# Patient Record
Sex: Female | Born: 1935 | Race: White | Hispanic: Yes | Smoking: Never smoker
Health system: Southern US, Community
[De-identification: ages and names within clinical notes are randomized; demographics above are authoritative.]

## PROBLEM LIST (undated history)

## (undated) DIAGNOSIS — I10 Essential (primary) hypertension: Secondary | ICD-10-CM

## (undated) DIAGNOSIS — I429 Cardiomyopathy, unspecified: Secondary | ICD-10-CM

## (undated) DIAGNOSIS — E7800 Pure hypercholesterolemia, unspecified: Secondary | ICD-10-CM

## (undated) DIAGNOSIS — H9190 Unspecified hearing loss, unspecified ear: Secondary | ICD-10-CM

## (undated) DIAGNOSIS — M1990 Unspecified osteoarthritis, unspecified site: Secondary | ICD-10-CM

## (undated) DIAGNOSIS — Z960 Presence of urogenital implants: Secondary | ICD-10-CM

## (undated) DIAGNOSIS — I2510 Atherosclerotic heart disease of native coronary artery without angina pectoris: Secondary | ICD-10-CM

## (undated) HISTORY — DX: Unspecified osteoarthritis, unspecified site: M19.90

## (undated) HISTORY — PX: CHOLECYSTECTOMY: SHX55

## (undated) HISTORY — DX: Essential (primary) hypertension: I10

## (undated) HISTORY — PX: CARDIAC CATHETERIZATION: SHX172

## (undated) HISTORY — PX: VAGINAL HYSTERECTOMY: SUR661

## (undated) HISTORY — DX: Atherosclerotic heart disease of native coronary artery without angina pectoris: I25.10

## (undated) HISTORY — DX: Cardiomyopathy, unspecified: I42.9

## (undated) HISTORY — DX: Pure hypercholesterolemia, unspecified: E78.00

## (undated) HISTORY — DX: Presence of urogenital implants: Z96.0

## (undated) HISTORY — DX: Unspecified hearing loss, unspecified ear: H91.90

---

## 1998-01-22 ENCOUNTER — Other Ambulatory Visit: Admission: RE | Admit: 1998-01-22 | Discharge: 1998-01-22 | Payer: Self-pay | Admitting: Gynecology

## 1998-01-30 ENCOUNTER — Ambulatory Visit (HOSPITAL_COMMUNITY): Admission: RE | Admit: 1998-01-30 | Discharge: 1998-01-30 | Payer: Self-pay | Admitting: Gastroenterology

## 2000-01-27 ENCOUNTER — Other Ambulatory Visit: Admission: RE | Admit: 2000-01-27 | Discharge: 2000-01-27 | Payer: Self-pay | Admitting: Gynecology

## 2001-01-27 ENCOUNTER — Other Ambulatory Visit: Admission: RE | Admit: 2001-01-27 | Discharge: 2001-01-27 | Payer: Self-pay | Admitting: Gynecology

## 2002-02-01 ENCOUNTER — Other Ambulatory Visit: Admission: RE | Admit: 2002-02-01 | Discharge: 2002-02-01 | Payer: Self-pay | Admitting: Gynecology

## 2004-02-06 ENCOUNTER — Other Ambulatory Visit: Admission: RE | Admit: 2004-02-06 | Discharge: 2004-02-06 | Payer: Self-pay | Admitting: Gynecology

## 2006-02-17 ENCOUNTER — Other Ambulatory Visit: Admission: RE | Admit: 2006-02-17 | Discharge: 2006-02-17 | Payer: Self-pay | Admitting: Gynecology

## 2012-04-04 ENCOUNTER — Encounter: Payer: Self-pay | Admitting: Gynecology

## 2012-06-21 ENCOUNTER — Ambulatory Visit: Payer: Self-pay | Admitting: Gynecology

## 2012-07-05 ENCOUNTER — Ambulatory Visit (INDEPENDENT_AMBULATORY_CARE_PROVIDER_SITE_OTHER): Payer: Medicare Other | Admitting: Gynecology

## 2012-07-05 ENCOUNTER — Encounter: Payer: Self-pay | Admitting: Gynecology

## 2012-07-05 VITALS — BP 130/70 | Ht 62.0 in | Wt 141.0 lb

## 2012-07-05 DIAGNOSIS — Z79890 Hormone replacement therapy (postmenopausal): Secondary | ICD-10-CM

## 2012-07-05 DIAGNOSIS — N993 Prolapse of vaginal vault after hysterectomy: Secondary | ICD-10-CM

## 2012-07-05 DIAGNOSIS — N952 Postmenopausal atrophic vaginitis: Secondary | ICD-10-CM

## 2012-07-05 MED ORDER — ESTRADIOL 0.1 MG/GM VA CREA: TOPICAL_CREAM | VAGINAL | Status: DC

## 2012-07-05 NOTE — Patient Instructions (Signed)
Followup in 4 months for pessary recheck. Stop using the estrogen patches. Continue using the estrogen vaginal cream twice weekly.

## 2012-07-05 NOTE — Progress Notes (Signed)
Kristen Lewis 1925-12-05 [id number]        79 y.o.  [id number] former patient of Dr. Nicholas Lose with pessary for vaginal prolapse. Several issues noted below.  Past medical history,surgical history, medications, allergies, family history and social history were all reviewed and documented in the EPIC chart. ROS:  Was performed and pertinent positives and negatives are included in the history.  Exam: Kim assistant Filed Vitals:   07/05/12 1029  BP: 130/70  Height: 5\' 2"  (1.575 m)  Weight: 141 lb (63.957 kg)   General appearance  Normal Skin grossly normal Head/Neck normal with no cervical or supraclavicular adenopathy thyroid normal Lungs  clear Cardiac RR, without RMG Abdominal  soft, nontender, without masses, organomegaly or hernia Breasts without masses, retractions, discharge or axillary adenopathy. Pelvic  Ext/BUS/vagina  with generalized atrophic changes  Gellhorn pessary was removed. Did have second-degree rectocele. Upper vagina appeared well supported but I did not provoke prolapse with straining. No significant cystocele Adnexa  Without masses or tenderness    Anus and perineum  normal   Rectovaginal  normal sphincter tone without palpated masses or tenderness.    Assessment/Plan:  79 y.o. [id number] female  1. History of vaginal prolapse with Gellhorn pessary doing well. Has been using this for years without difficulty. Has it changed every 4 months.  I removed her pessary cleaned and replaced it. She did not have overt vaginal apex prolapse although I did not have her strain significantly to provoke this. She did have a second degree to third degree rectocele which was well supported with the pessary. Patient will return in 4 months for pessary recheck. 2. Hormone replacement. Patient using Vivelle-Dot 0.25 mg patches and has been for a number of years. Started in her mid 13s early 76s. Also uses Estrace vaginal cream twice weekly quarter applicator. I reviewed the whole issue of  HRT to include the WHI study and increased risk of stroke heart attack DVT and breast cancer. The ACOG NAMS statements for lowest dose for shortest period of time also reviewed. My suggestion would be to stop the Vivelle patch and continue with the vaginal Estrace cream at present and she agrees with this. The issues of absorption also discussed. 3. Mammography 03/2012. Continue with annual mammography. SBE monthly reviewed. 4. Pap smear. No Pap smear done today. No history of abnormal Pap smears previously.  I reviewed current screening guidelines and as she is status post vaginal hysterectomy for benign indications and over the age of 16 I recommend stop screening and she agrees with this. 5. Colonoscopy 5 years ago. Repeat at their recommended interval. 6. Health maintenance. Actively is followed by her primary physician and the blood work done today. Followup 4 months for pessary recheck.  Dara Lords MD, 11:45 AM 07/05/2012

## 2012-10-26 ENCOUNTER — Ambulatory Visit (INDEPENDENT_AMBULATORY_CARE_PROVIDER_SITE_OTHER): Payer: Medicare Other | Admitting: Gynecology

## 2012-10-26 ENCOUNTER — Encounter: Payer: Self-pay | Admitting: Gynecology

## 2012-10-26 DIAGNOSIS — Z79890 Hormone replacement therapy (postmenopausal): Secondary | ICD-10-CM

## 2012-10-26 DIAGNOSIS — N816 Rectocele: Secondary | ICD-10-CM

## 2012-10-26 NOTE — Progress Notes (Signed)
Patient presents for followup recheck for pessary. Has stopped her Vivelle patches and done well with this. No significant hot flushes sweats or other symptoms. Continues to use estrogen vaginal cream intermittently.  Exam with Selena Batten assistant External BUS vagina with atrophic changes. Gelhorn pessary removed cleaned and replaced without difficulty. Second degree rectocele noted after removal. Cuff appears well supported. No overt cystocele at this time but did not provoke on exam. No evidence of ulceration or vaginal trauma. Bimanual without masses or tenderness  Assessment and plan: Vaginal prolapse with rectocele. Gellhorn pessary working well. Removed cleansed and replaced. Patient will followup in 4 months for recheck. Sooner if any issues. She appears to be doing well off of the Vivelle patches and will continue to stay off them. She'll continue to use the estrogen vaginal cream which helps with her vaginal atrophic changes.

## 2012-10-26 NOTE — Patient Instructions (Signed)
Follow up in 4 months for pessary recheck 

## 2013-02-21 ENCOUNTER — Ambulatory Visit (INDEPENDENT_AMBULATORY_CARE_PROVIDER_SITE_OTHER): Payer: Medicare Other | Admitting: Gynecology

## 2013-02-21 ENCOUNTER — Encounter: Payer: Self-pay | Admitting: Gynecology

## 2013-02-21 DIAGNOSIS — N993 Prolapse of vaginal vault after hysterectomy: Secondary | ICD-10-CM

## 2013-02-21 DIAGNOSIS — N816 Rectocele: Secondary | ICD-10-CM

## 2013-02-21 DIAGNOSIS — N952 Postmenopausal atrophic vaginitis: Secondary | ICD-10-CM

## 2013-02-21 NOTE — Patient Instructions (Signed)
Follow up in 6 months for annual exam

## 2013-02-21 NOTE — Progress Notes (Signed)
Patient presents for pessary recheck. Has vaginal prolapse with rectocele status post hysterectomy in the past. Doing well with her pessary. Uses Estrace vaginal cream twice weekly. Has done well since stopping her estrogen patches.  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 Gelhorn pessary removed. Vaginal exam without evidence of irritation or erosion. Vaginal prolapse/rectocele noted. Bimanual exam without masses or tenderness. Pessary was cleansed and replaced.  Assessment and plan: Vaginal prolapse with rectocele doing well with Gellhorn pessary. Followup in 6 months for pessary recheck and general exam. Sooner if any issues. Continue on the estrogen vaginal cream twice weekly

## 2013-08-24 ENCOUNTER — Encounter: Payer: Medicare Other | Admitting: Gynecology

## 2013-08-29 ENCOUNTER — Encounter: Payer: Medicare Other | Admitting: Gynecology

## 2013-09-05 ENCOUNTER — Other Ambulatory Visit: Payer: Self-pay

## 2013-09-13 ENCOUNTER — Encounter: Payer: Medicare Other | Admitting: Gynecology

## 2013-11-01 ENCOUNTER — Encounter: Payer: Self-pay | Admitting: Gynecology

## 2013-11-01 ENCOUNTER — Ambulatory Visit (INDEPENDENT_AMBULATORY_CARE_PROVIDER_SITE_OTHER): Payer: Medicare Other | Admitting: Gynecology

## 2013-11-01 VITALS — BP 134/70 | Ht 61.5 in | Wt 138.0 lb

## 2013-11-01 DIAGNOSIS — N952 Postmenopausal atrophic vaginitis: Secondary | ICD-10-CM

## 2013-11-01 DIAGNOSIS — N993 Prolapse of vaginal vault after hysterectomy: Secondary | ICD-10-CM

## 2013-11-01 DIAGNOSIS — N816 Rectocele: Secondary | ICD-10-CM

## 2013-11-01 MED ORDER — ESTRADIOL 0.1 MG/GM VA CREA: TOPICAL_CREAM | VAGINAL | Status: DC

## 2013-11-01 NOTE — Progress Notes (Signed)
DAJA SHUPING 10-30-1925 [id number]        80 y.o.  [id number] for follow up exam.  Past medical history,surgical history, problem list, medications, allergies, family history and social history were all reviewed and documented as reviewed in the EPIC chart.  ROS:  12 system ROS performed with pertinent positives and negatives included in the history, assessment and plan.   Additional significant findings :  none   Exam: [facility] person Vitals:   11/01/13 1415  BP: 134/70  Height: 5' 1.5" (1.562 m)  Weight: 138 lb (62.596 kg)   General appearance:  Normal affect, orientation and appearance. Skin: Grossly normal HEENT: Without gross lesions.  No cervical or supraclavicular adenopathy. Thyroid normal.  Lungs:  Clear without wheezing, rales or rhonchi Cardiac: RR, without RMG Abdominal:  Soft, nontender, without masses, guarding, rebound, organomegaly or hernia Breasts:  Examined lying and sitting without masses, retractions, discharge or axillary adenopathy. Pelvic:  Ext/BUS/vagina generalized atrophic changes.  Gelhorn pessary was removed.  Vaginal prolapse and rectocele noted. Vaginal mucosa without evidence of erosions or irritation.  Bimanual exam without masses or tenderness. Gelhorn pessary was cleaned and replaced.  Anus and perineum  Normal   Rectovaginal  Normal sphincter tone without palpated masses or tenderness.    Assessment/Plan:  80 y.o. [id number] female for follow up exam.   1. Vaginal prolapse with rectocele. Using Gellhorn pessary doing well. Also uses Estrace vaginal cream twice weekly for vaginal support. We again discussed the issues and risks to include absorption and systemic side effects such as stroke heart attack DVT of breast cancer. Patient comfortable continuing I refilled her x1 year. Otherwise doing well with her pessary which was cleaned and replaced today. 2. Mammography 03/2012. Recommended patient schedule mammogram now she agrees to do so. SBE  monthly reviewed. 3. DEXA 2013. Patients in the process of scheduling now and will follow up for this. Increased calcium vitamin D reviewed. 4. Colonoscopy reported 6 years ago. States that they told her she no longer needed to do these. 5. Pap smear a number of years ago. No Pap smear done today.  No history of significant abnormal Pap smears. Status post hysterectomy for benign indications. Over the age of 6. Reviewed current screening guidelines we both agree to stop screening. 6. Health maintenance. No routine blood work done as she reports this done at her primary physician's office. Follow up in 6 months for pessary recheck, sooner as needed.     Dara Lords MD, 2:38 PM 11/01/2013

## 2013-11-01 NOTE — Patient Instructions (Signed)
Follow-up in 6 months for pessary recheck.  Sooner if any issues. 

## 2013-11-02 LAB — URINALYSIS W MICROSCOPIC + REFLEX CULTURE
BILIRUBIN URINE: NEGATIVE
CASTS: NONE SEEN
Crystals: NONE SEEN
Glucose, UA: NEGATIVE mg/dL
Ketones, ur: NEGATIVE mg/dL
Nitrite: NEGATIVE
PH: 6 (ref 5.0–8.0)
PROTEIN: 30 mg/dL — AB
Specific Gravity, Urine: >1.03 — ABNORMAL HIGH (ref 1.005–1.030)
Urobilinogen, UA: 0.2 mg/dL (ref 0.0–1.0)

## 2013-11-03 LAB — URINE CULTURE

## 2013-12-11 ENCOUNTER — Encounter: Payer: Self-pay | Admitting: Gynecology

## 2014-05-02 ENCOUNTER — Ambulatory Visit: Payer: Medicare Other | Admitting: Gynecology

## 2014-05-03 ENCOUNTER — Ambulatory Visit (INDEPENDENT_AMBULATORY_CARE_PROVIDER_SITE_OTHER): Payer: Medicare Other | Admitting: Gynecology

## 2014-05-03 ENCOUNTER — Encounter: Payer: Self-pay | Admitting: Gynecology

## 2014-05-03 VITALS — BP 138/80 | Ht 61.0 in | Wt 138.0 lb

## 2014-05-03 DIAGNOSIS — N993 Prolapse of vaginal vault after hysterectomy: Secondary | ICD-10-CM | POA: Diagnosis not present

## 2014-05-03 NOTE — Progress Notes (Signed)
Kristen DoomCatherine A Lewis Aug 22, 1925 [id number]        81 y.o.  [id number] presents for pessary check at 6 months from her last exam. History of vaginal vault prolapse with rectocele status post hysterectomy. Doing well with her Gellhorn pessary.  Past medical history,surgical history, problem list, medications, allergies, family history and social history were all reviewed and documented in the EPIC chart.  Directed ROS with pertinent positives and negatives documented in the history of present illness/assessment and plan.  Exam: Bari MantisKim Alexis assistant Filed Vitals:   05/03/14 1024  BP: 138/80  Height: 5\' 1"  (1.549 m)  Weight: 138 lb (62.596 kg)   General appearance:  Normal Abdomen soft nontender without masses guarding rebound Pelvic external BUS vagina with atrophic changes. Generalized prolapse after removal of her pessary with rectocele. No evidence of irritation or erosion in the vaginal mucosa.  Bimanual without masses or tenderness.  Her Gellhorn pessary was removed cleansed and replaced without difficulty.  Assessment/Plan:  81 y.o. [id number] with vaginal vault prolapse/rectocele after hysterectomy. Doing well with Gellhorn pessary. Using Estrace cream twice weekly. Reviewed issues of estrogen cream and absorption and risks of stroke heart attack DVT and breast cancer. Patient's comfortable continuing. We'll follow up in 6 months for annual exam. Sooner if any issues.    Dara LordsFONTAINE,Teng Decou P MD, 10:52 AM 05/03/2014

## 2014-05-03 NOTE — Patient Instructions (Signed)
Follow-up in 6 months for pessary recheck. 

## 2014-09-28 ENCOUNTER — Emergency Department (HOSPITAL_COMMUNITY)
Admission: EM | Admit: 2014-09-28 | Discharge: 2014-09-28 | Disposition: A | Discharge status: Home / Self Care | Payer: No Typology Code available for payment source | Attending: Emergency Medicine | Admitting: Emergency Medicine

## 2014-09-28 ENCOUNTER — Encounter (HOSPITAL_COMMUNITY): Payer: Self-pay | Admitting: Emergency Medicine

## 2014-09-28 DIAGNOSIS — Y998 Other external cause status: Secondary | ICD-10-CM | POA: Insufficient documentation

## 2014-09-28 DIAGNOSIS — Y92410 Unspecified street and highway as the place of occurrence of the external cause: Secondary | ICD-10-CM | POA: Insufficient documentation

## 2014-09-28 DIAGNOSIS — H9190 Unspecified hearing loss, unspecified ear: Secondary | ICD-10-CM | POA: Diagnosis not present

## 2014-09-28 DIAGNOSIS — Z79899 Other long term (current) drug therapy: Secondary | ICD-10-CM | POA: Diagnosis not present

## 2014-09-28 DIAGNOSIS — E780 Pure hypercholesterolemia: Secondary | ICD-10-CM | POA: Insufficient documentation

## 2014-09-28 DIAGNOSIS — Y9389 Activity, other specified: Secondary | ICD-10-CM | POA: Insufficient documentation

## 2014-09-28 DIAGNOSIS — I10 Essential (primary) hypertension: Secondary | ICD-10-CM | POA: Insufficient documentation

## 2014-09-28 DIAGNOSIS — M1990 Unspecified osteoarthritis, unspecified site: Secondary | ICD-10-CM | POA: Diagnosis not present

## 2014-09-28 DIAGNOSIS — S1081XA Abrasion of other specified part of neck, initial encounter: Secondary | ICD-10-CM | POA: Diagnosis not present

## 2014-09-28 DIAGNOSIS — S199XXA Unspecified injury of neck, initial encounter: Secondary | ICD-10-CM | POA: Diagnosis present
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
## 2014-09-28 NOTE — Discharge Instructions (Signed)
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
## 2014-11-05 ENCOUNTER — Ambulatory Visit (INDEPENDENT_AMBULATORY_CARE_PROVIDER_SITE_OTHER): Payer: Medicare Other | Admitting: Gynecology

## 2014-11-05 ENCOUNTER — Encounter: Payer: Self-pay | Admitting: Gynecology

## 2014-11-05 VITALS — BP 120/78 | Ht 62.0 in | Wt 145.0 lb

## 2014-11-05 DIAGNOSIS — N819 Female genital prolapse, unspecified: Secondary | ICD-10-CM | POA: Diagnosis not present

## 2014-11-05 DIAGNOSIS — Z01419 Encounter for gynecological examination (general) (routine) without abnormal findings: Secondary | ICD-10-CM

## 2014-11-05 DIAGNOSIS — Z23 Encounter for immunization: Secondary | ICD-10-CM | POA: Diagnosis not present

## 2014-11-05 DIAGNOSIS — N952 Postmenopausal atrophic vaginitis: Secondary | ICD-10-CM | POA: Diagnosis not present

## 2014-11-05 DIAGNOSIS — N816 Rectocele: Secondary | ICD-10-CM | POA: Diagnosis not present

## 2014-11-05 MED ORDER — ESTRADIOL 0.1 MG/GM VA CREA: TOPICAL_CREAM | VAGINAL | Status: DC

## 2014-11-05 NOTE — Patient Instructions (Signed)

## 2014-11-05 NOTE — Progress Notes (Signed)
OCTA UPLINGER 1925-09-13 [id number]        81 y.o.  [id number] for breast and pelvic exam.  Past medical history,surgical history, problem list, medications, allergies, family history and social history were all reviewed and documented as reviewed in the EPIC chart.  ROS:  Performed with pertinent positives and negatives included in the history, assessment and plan.   Additional significant findings :  none   Exam: [facility] person Vitals:   11/05/14 1401  BP: 120/78  Height:  (1.575 m)  Weight: 145 lb (65.772 kg)   General appearance:  Normal affect, orientation and appearance. Skin: Grossly normal HEENT: Without gross lesions.  No cervical or supraclavicular adenopathy. Thyroid normal.  Lungs:  Clear without wheezing, rales or rhonchi Cardiac: RR, without RMG Abdominal:  Soft, nontender, without masses, guarding, rebound, organomegaly or hernia Breasts:  Examined lying and sitting without masses, retractions, discharge or axillary adenopathy. Pelvic:  Ext/BUS/vagina with atrophic changes.  Her Gellhorn pessary was removed and vaginal exam shows no evidence of irritation or erosion. Pessary was cleansed and replaced without difficulty.  Bimanual exam without masses or tenderness  Anus and perineum  Normal   Rectovaginal  Normal sphincter tone without palpated masses or tenderness.    Assessment/Plan:  81 y.o. [id number] female for breast and pelvic exam.   1. Vaginal vault prolapse with rectocele. Status post hysterectomy in the past. Using Gellhorn pessary with good results. Using Estrace vaginal cream twice weekly for vaginal support doing well with this. We discussed the issues of absorption risks in the past on multiple occasions. She is comfortable using this and I refilled her 1 year. 2. Pap smear a number of years ago. No Pap smear done today.  No history of abnormal Pap smears previously.  We both decided to stop screening per current screening  guidelines. 3. Mammography 03/2014. Continue with annual mammography. SBE month are reviewed. 4. Bone density 2016.  I do not have copies of these results and she will follow up with her primary physician in reference to this he was following her bone health. 5. Colonoscopy 7 years ago. Repeat at their recommended interval. 6. Health maintenance. No routine blood work done as this is done through her primary physician's office. Follow up 1 year, sooner as needed.   Dara Lords MD, 2:30 PM 11/05/2014

## 2014-11-05 NOTE — Addendum Note (Signed)
Addended by: Dayna Barker on: 11/05/2014 03:37 PM   Modules accepted: Orders

## 2015-04-25 ENCOUNTER — Encounter: Payer: Self-pay | Admitting: Gynecology

## 2015-04-29 ENCOUNTER — Ambulatory Visit (INDEPENDENT_AMBULATORY_CARE_PROVIDER_SITE_OTHER): Payer: Medicare Other | Admitting: Gynecology

## 2015-04-29 ENCOUNTER — Encounter: Payer: Self-pay | Admitting: Gynecology

## 2015-04-29 VITALS — BP 130/60

## 2015-04-29 DIAGNOSIS — N993 Prolapse of vaginal vault after hysterectomy: Secondary | ICD-10-CM

## 2015-04-29 NOTE — Patient Instructions (Signed)
Follow up in 4-6 months for pessary recheck 

## 2015-04-29 NOTE — Progress Notes (Signed)
    Kristen DoomCatherine A Lewis 07/24/1925 [id number]        82 y.o.  [id number] presents for pessary check with history of vaginal vault prolapse after hysterectomy with good results using a Gellhorn pessary.  Also using vaginal estrogen cream once weekly. No bleeding or discomfort  Past medical history,surgical history, problem list, medications, allergies, family history and social history were all reviewed and documented in the EPIC chart.  Directed ROS with pertinent positives and negatives documented in the history of present illness/assessment and plan.  Exam: Kennon PortelaKim Gardner assistant Filed Vitals:   04/29/15 1422  BP: 130/60   General appearance:  Normal Abdomen soft nontender without masses guarding rebound Pelvic external BUS vagina with atrophic changes. Gelhorn pessary removed. Vaginal mucosa without evidence of irritation or erosion. Prolapse/rectocele noted. Bimanual exam without masses or tenderness  Gelhorn pessary was removed, cleansed and replaced without difficulty.  Assessment/Plan:  82 y.o. [id number] with vaginal vault prolapse after hysterectomy with good response to Gellhorn pessary. Follow up in 4-6 months for pessary recheck.    Dara LordsFONTAINE,Shoaib Siefker P MD, 2:46 PM 04/29/2015

## 2015-09-30 ENCOUNTER — Ambulatory Visit: Payer: Medicare Other | Admitting: Gynecology

## 2015-10-07 ENCOUNTER — Encounter: Payer: Self-pay | Admitting: Gynecology

## 2015-10-07 ENCOUNTER — Ambulatory Visit (INDEPENDENT_AMBULATORY_CARE_PROVIDER_SITE_OTHER): Payer: Medicare Other | Admitting: Gynecology

## 2015-10-07 VITALS — BP 130/70

## 2015-10-07 DIAGNOSIS — R922 Inconclusive mammogram: Secondary | ICD-10-CM

## 2015-10-07 DIAGNOSIS — N993 Prolapse of vaginal vault after hysterectomy: Secondary | ICD-10-CM | POA: Diagnosis not present

## 2015-10-07 NOTE — Patient Instructions (Signed)
Follow up in 6 months for pessary reexamination. 

## 2015-10-07 NOTE — Progress Notes (Signed)
    Kristen DoomCatherine A Lewis [birth year] [id number]        82 y.o.  [id number] dents for pessary check with history of vaginal vault prolapse after hysterectomy. Using Gellhorn pessary with good results. Also using vaginal estrogen cream once weekly for vaginal support. Patient does have a history of dense breast and asked by would do a breast exam. She has no specific palpable abnormalities but would feel more reassured with a provider exam.  Past medical history,surgical history, problem list, medications, allergies, family history and social history were all reviewed and documented in the EPIC chart.  Directed ROS with pertinent positives and negatives documented in the history of present illness/assessment and plan.  Exam: Kennon PortelaKim Gardner assistant Vitals:   10/07/15 1403  BP: 130/70   General appearance:  Normal Both breast examined lying and sitting without masses, retractions, discharge, adenopathy Pelvic external BUS vagina with atrophic changes. Gellhorn pessary was removed. Vaginal mucosa without evidence of significant irritation or erosion. Prolapse/rectocele noted. Bimanual exam without masses or tenderness.  Procedure: Patients Gellhorn pessary was removed, cleansed and replaced without difficulty.  Assessment/Plan:  82 y.o. [id number] with vaginal vault prolapse after hysterectomy. Has good response to Gellhorn pessary. Has normal breast exam. Will continue with annual mammography. Will follow up in 6 months for pessary recheck. Will continue to use estrogen vaginal cream once weekly.    Dara LordsFONTAINE,Natina Wiginton P MD, 2:24 PM 10/07/2015

## 2016-02-17 ENCOUNTER — Inpatient Hospital Stay (HOSPITAL_COMMUNITY): Payer: Medicare Other

## 2016-02-17 ENCOUNTER — Inpatient Hospital Stay (HOSPITAL_COMMUNITY)
Admission: AD | Admit: 2016-02-17 | Discharge: 2016-02-20 | DRG: 175 | Disposition: A | Discharge status: Home Health | Payer: Medicare Other | Source: Ambulatory Visit | Attending: Family Medicine | Admitting: Family Medicine

## 2016-02-17 ENCOUNTER — Encounter (HOSPITAL_COMMUNITY): Payer: Self-pay

## 2016-02-17 DIAGNOSIS — R0600 Dyspnea, unspecified: Secondary | ICD-10-CM | POA: Diagnosis not present

## 2016-02-17 DIAGNOSIS — E876 Hypokalemia: Secondary | ICD-10-CM | POA: Diagnosis present

## 2016-02-17 DIAGNOSIS — Z79899 Other long term (current) drug therapy: Secondary | ICD-10-CM | POA: Diagnosis not present

## 2016-02-17 DIAGNOSIS — I252 Old myocardial infarction: Secondary | ICD-10-CM

## 2016-02-17 DIAGNOSIS — R05 Cough: Secondary | ICD-10-CM | POA: Diagnosis present

## 2016-02-17 DIAGNOSIS — J9811 Atelectasis: Secondary | ICD-10-CM | POA: Diagnosis present

## 2016-02-17 DIAGNOSIS — I2699 Other pulmonary embolism without acute cor pulmonale: Secondary | ICD-10-CM | POA: Diagnosis present

## 2016-02-17 DIAGNOSIS — Z7982 Long term (current) use of aspirin: Secondary | ICD-10-CM

## 2016-02-17 DIAGNOSIS — I5042 Chronic combined systolic (congestive) and diastolic (congestive) heart failure: Secondary | ICD-10-CM | POA: Diagnosis present

## 2016-02-17 DIAGNOSIS — I110 Hypertensive heart disease with heart failure: Secondary | ICD-10-CM | POA: Diagnosis present

## 2016-02-17 DIAGNOSIS — J9601 Acute respiratory failure with hypoxia: Secondary | ICD-10-CM | POA: Diagnosis present

## 2016-02-17 DIAGNOSIS — E785 Hyperlipidemia, unspecified: Secondary | ICD-10-CM

## 2016-02-17 DIAGNOSIS — I2510 Atherosclerotic heart disease of native coronary artery without angina pectoris: Secondary | ICD-10-CM | POA: Diagnosis present

## 2016-02-17 DIAGNOSIS — D649 Anemia, unspecified: Secondary | ICD-10-CM | POA: Diagnosis present

## 2016-02-17 DIAGNOSIS — E7800 Pure hypercholesterolemia, unspecified: Secondary | ICD-10-CM | POA: Diagnosis present

## 2016-02-17 DIAGNOSIS — Z9861 Coronary angioplasty status: Secondary | ICD-10-CM

## 2016-02-17 DIAGNOSIS — I509 Heart failure, unspecified: Secondary | ICD-10-CM | POA: Diagnosis not present

## 2016-02-17 DIAGNOSIS — Z8249 Family history of ischemic heart disease and other diseases of the circulatory system: Secondary | ICD-10-CM

## 2016-02-17 DIAGNOSIS — I10 Essential (primary) hypertension: Secondary | ICD-10-CM | POA: Diagnosis not present

## 2016-02-17 DIAGNOSIS — M1990 Unspecified osteoarthritis, unspecified site: Secondary | ICD-10-CM | POA: Diagnosis present

## 2016-02-17 DIAGNOSIS — E871 Hypo-osmolality and hyponatremia: Secondary | ICD-10-CM

## 2016-02-17 DIAGNOSIS — Z800 Family history of malignant neoplasm of digestive organs: Secondary | ICD-10-CM

## 2016-02-17 DIAGNOSIS — J189 Pneumonia, unspecified organism: Secondary | ICD-10-CM | POA: Diagnosis present

## 2016-02-17 DIAGNOSIS — Z9889 Other specified postprocedural states: Secondary | ICD-10-CM

## 2016-02-17 DIAGNOSIS — Y95 Nosocomial condition: Secondary | ICD-10-CM | POA: Diagnosis present

## 2016-02-17 DIAGNOSIS — R079 Chest pain, unspecified: Secondary | ICD-10-CM

## 2016-02-17 DIAGNOSIS — R0602 Shortness of breath: Secondary | ICD-10-CM

## 2016-02-17 DIAGNOSIS — J90 Pleural effusion, not elsewhere classified: Secondary | ICD-10-CM

## 2016-02-17 LAB — CBC WITH DIFFERENTIAL/PLATELET
BASOS ABS: 0 10*3/uL (ref 0.0–0.1)
Basophils Relative: 0 %
EOS PCT: 1 %
Eosinophils Absolute: 0.1 10*3/uL (ref 0.0–0.7)
HCT: 30.6 % — ABNORMAL LOW (ref 36.0–46.0)
Hemoglobin: 10 g/dL — ABNORMAL LOW (ref 12.0–15.0)
LYMPHS PCT: 10 %
Lymphs Abs: 1.4 10*3/uL (ref 0.7–4.0)
MCH: 29.2 pg (ref 26.0–34.0)
MCHC: 32.7 g/dL (ref 30.0–36.0)
MCV: 89.5 fL (ref 78.0–100.0)
MONO ABS: 1.3 10*3/uL — AB (ref 0.1–1.0)
Monocytes Relative: 9 %
NEUTROS ABS: 11.4 10*3/uL — AB (ref 1.7–7.7)
Neutrophils Relative %: 80 %
PLATELETS: 294 10*3/uL (ref 150–400)
RBC: 3.42 MIL/uL — AB (ref 3.87–5.11)
RDW: 14.9 % (ref 11.5–15.5)
WBC: 14.1 10*3/uL — AB (ref 4.0–10.5)

## 2016-02-17 LAB — COMPREHENSIVE METABOLIC PANEL
ALT: 16 U/L (ref 14–54)
AST: 16 U/L (ref 15–41)
Albumin: 3.3 g/dL — ABNORMAL LOW (ref 3.5–5.0)
Alkaline Phosphatase: 74 U/L (ref 38–126)
Anion gap: 11 (ref 5–15)
BUN: 14 mg/dL (ref 6–20)
CHLORIDE: 99 mmol/L — AB (ref 101–111)
CO2: 24 mmol/L (ref 22–32)
CREATININE: 0.77 mg/dL (ref 0.44–1.00)
Calcium: 8.8 mg/dL — ABNORMAL LOW (ref 8.9–10.3)
GFR calc Af Amer: >60 mL/min (ref >60)
GLUCOSE: 170 mg/dL — AB (ref 65–99)
POTASSIUM: 3.7 mmol/L (ref 3.5–5.1)
SODIUM: 134 mmol/L — AB (ref 135–145)
Total Bilirubin: 0.8 mg/dL (ref 0.3–1.2)
Total Protein: 7.5 g/dL (ref 6.5–8.1)

## 2016-02-17 LAB — D-DIMER, QUANTITATIVE: D-Dimer, Quant: 1.83 ug/mL-FEU — ABNORMAL HIGH (ref 0.00–0.50)

## 2016-02-17 LAB — TROPONIN I: Troponin I: 0.03 ng/mL (ref <0.03)

## 2016-02-17 LAB — BRAIN NATRIURETIC PEPTIDE: B Natriuretic Peptide: 949 pg/mL — ABNORMAL HIGH (ref 0.0–100.0)

## 2016-02-17 MED ORDER — FUROSEMIDE 40 MG PO TABS
40.0000 mg | ORAL_TABLET | Freq: Every day | ORAL | Status: DC
  Administered 2016-02-18: 40 mg via ORAL
  Filled 2016-02-17: qty 1

## 2016-02-17 MED ORDER — NITROGLYCERIN 0.4 MG/HR TD PT24
0.4000 mg | MEDICATED_PATCH | Freq: Every day | TRANSDERMAL | Status: DC
  Administered 2016-02-17 – 2016-02-20 (×4): 0.4 mg via TRANSDERMAL
  Filled 2016-02-17 (×4): qty 1

## 2016-02-17 MED ORDER — ONDANSETRON HCL 4 MG/2ML IJ SOLN: 4.0000 mg | Freq: Four times a day (QID) | INTRAMUSCULAR | Status: DC | PRN

## 2016-02-17 MED ORDER — OSELTAMIVIR PHOSPHATE 75 MG PO CAPS
75.0000 mg | ORAL_CAPSULE | Freq: Two times a day (BID) | ORAL | Status: DC
  Administered 2016-02-17 – 2016-02-18 (×2): 75 mg via ORAL
  Filled 2016-02-17 (×2): qty 1

## 2016-02-17 MED ORDER — DEXTROSE 5 % IV SOLN
1.0000 g | Freq: Three times a day (TID) | INTRAVENOUS | Status: DC
  Administered 2016-02-17 – 2016-02-18 (×2): 1 g via INTRAVENOUS
  Filled 2016-02-17 (×3): qty 1

## 2016-02-17 MED ORDER — PAROXETINE HCL 20 MG PO TABS: 20.0000 mg | ORAL_TABLET | Freq: Every day | ORAL | Status: DC

## 2016-02-17 MED ORDER — ACETAMINOPHEN 650 MG RE SUPP: 650.0000 mg | Freq: Four times a day (QID) | RECTAL | Status: DC | PRN

## 2016-02-17 MED ORDER — NITROGLYCERIN 0.4 MG SL SUBL: 0.4000 mg | SUBLINGUAL_TABLET | SUBLINGUAL | Status: DC | PRN

## 2016-02-17 MED ORDER — CARVEDILOL 6.25 MG PO TABS
6.2500 mg | ORAL_TABLET | Freq: Two times a day (BID) | ORAL | Status: DC
  Administered 2016-02-18 – 2016-02-20 (×5): 6.25 mg via ORAL
  Filled 2016-02-17 (×5): qty 1

## 2016-02-17 MED ORDER — ENOXAPARIN SODIUM 40 MG/0.4ML ~~LOC~~ SOLN
40.0000 mg | SUBCUTANEOUS | Status: DC
  Administered 2016-02-17: 40 mg via SUBCUTANEOUS
  Filled 2016-02-17: qty 0.4

## 2016-02-17 MED ORDER — ATORVASTATIN CALCIUM 10 MG PO TABS
10.0000 mg | ORAL_TABLET | Freq: Every day | ORAL | Status: DC
  Administered 2016-02-18 – 2016-02-19 (×2): 10 mg via ORAL
  Filled 2016-02-17 (×2): qty 1

## 2016-02-17 MED ORDER — VANCOMYCIN HCL IN DEXTROSE 1-5 GM/200ML-% IV SOLN
1000.0000 mg | INTRAVENOUS | Status: AC
  Administered 2016-02-18: 1000 mg via INTRAVENOUS
  Filled 2016-02-17: qty 200

## 2016-02-17 MED ORDER — ACETAMINOPHEN 325 MG PO TABS: 650.0000 mg | ORAL_TABLET | Freq: Four times a day (QID) | ORAL | Status: DC | PRN

## 2016-02-17 MED ORDER — ONDANSETRON HCL 4 MG PO TABS: 4.0000 mg | ORAL_TABLET | Freq: Four times a day (QID) | ORAL | Status: DC | PRN

## 2016-02-17 MED ORDER — PAROXETINE HCL 20 MG PO TABS
20.0000 mg | ORAL_TABLET | Freq: Every day | ORAL | Status: DC
  Administered 2016-02-18 – 2016-02-20 (×3): 20 mg via ORAL
  Filled 2016-02-17 (×3): qty 1

## 2016-02-17 NOTE — H&P (Signed)
History and Physical    Kristen Lewis JYN:[id number] DOB: January 05, 1926 DOA: 02/17/2016  PCP: Gaspar Garbe, MD  Patient coming from: Home. Patient was a direct admit. Patient was sent by Dr. Celene Skeen.  Chief Complaint: Chest pain. Cough.  HPI: Kristen Lewis is a 83 y.o. female with history of hypertension CHF has been experiencing chest pain on deep inspiration last 2 days. Patient also has been having some non-productive cough with subjective feeling of fever and chills. Patient went to the urgent care center. At the urgent care center patient was recorded to have a temperature of 100.20F. Labs done showed WBC of 15,000 with chest x-ray showing left lower lobe infiltrates and effusion. Since patient has persistent pleuritic chest pain patient was admitted directly for further management of pneumonia.  Patient states in November 2017 2 months ago patient had been admitted to the hospital in [place] for CHF and at that time patient also had undergone cardiac cath but did not have any stents placed. Since then patient has been on Lasix Coreg aspirin and statins.  Patient does get short of breath on exertion sometimes which improves with Lasix. Patient is presently not short of breath.   ED Course: Patient was a direct admit.  Review of Systems: As per HPI, rest all negative.   Past Medical History:  Diagnosis Date  . Arthritis   . Elevated cholesterol   . Hearing deficit   . Hypertension   . Vaginal pessary present     Past Surgical History:  Procedure Laterality Date  . CARDIAC CATHETERIZATION    . CHOLECYSTECTOMY    . VAGINAL HYSTERECTOMY     Leiomyomata/bleeding     reports that she has never smoked. She has never used smokeless tobacco. She reports that she drinks alcohol. She reports that she does not use drugs.  No Known Allergies  Family History  Problem Relation Age of Onset  . Cancer Mother     Intestinal/Liver  . Hypertension Mother   . Hypertension  Father   . Heart attack Father   . Hypertension Brother   . Heart disease Brother     Prior to Admission medications   Medication Sig Start Date End Date Taking? Authorizing Provider  carvedilol (COREG) 3.125 MG tablet Take 3.125 mg by mouth 2 (two) times daily. 01/31/16  Yes Historical Provider, MD  cholecalciferol (VITAMIN D) 1000 UNITS tablet Take 1,000 Units by mouth every morning.    Yes Historical Provider, MD  Potassium Chloride ER 20 MEQ TBCR Take 20 mEq by mouth daily. 02/06/16  Yes Historical Provider, MD  aspirin 81 MG tablet Take 81 mg by mouth daily.    Historical Provider, MD  atorvastatin (LIPITOR) 20 MG tablet Take 20 mg by mouth daily. 02/01/16   Historical Provider, MD  Calcium-Magnesium-Vitamin D (CALCIUM 500 PO) Take 1 tablet by mouth daily.    Historical Provider, MD  estradiol (ESTRACE) 0.1 MG/GM vaginal cream Use 1/4 applicator twice weekly 11/05/14   Dara Lords, MD  furosemide (LASIX) 40 MG tablet Take 40 mg by mouth daily. 02/06/16   Historical Provider, MD  ibuprofen (ADVIL,MOTRIN) 600 MG tablet Take 600 mg by mouth daily with breakfast.    Historical Provider, MD  lisinopril (PRINIVIL,ZESTRIL) 40 MG tablet Take 40 mg by mouth daily.    Historical Provider, MD  Multiple Vitamins-Minerals ([employer] SPECTRAVITE ADULT 50+) TABS Take 1 tablet by mouth daily.    Historical Provider, MD  nitroGLYCERIN (NITRODUR - DOSED  IN MG/24 HR) 0.2 mg/hr patch Place 1 patch onto the skin daily. 02/01/16   Historical Provider, MD  PARoxetine (PAXIL) 20 MG tablet Take 20 mg by mouth daily.    Historical Provider, MD    Physical Exam: Vitals:   02/17/16 2055 02/18/16 0453  BP: (!) 147/80 123/76  Pulse: 85 (!) 102  Resp: 18 16  Temp: 98.3 F (36.8 C) 99.3 F (37.4 C)  TempSrc: Oral Oral  SpO2: 100% 98%  Weight: 61.9 kg (136 lb 7.4 oz)   Height: 5\' 2"  (1.575 m)       Constitutional: Moderately built and nourished. Vitals:   02/17/16 2055 02/18/16 0453  BP: (!) 147/80  123/76  Pulse: 85 (!) 102  Resp: 18 16  Temp: 98.3 F (36.8 C) 99.3 F (37.4 C)  TempSrc: Oral Oral  SpO2: 100% 98%  Weight: 61.9 kg (136 lb 7.4 oz)   Height: 5\' 2"  (1.575 m)    Eyes: Anicteric no pallor. ENMT: No discharge from the ears eyes nose and mouth. Neck: No mass felt. No neck rigidity. No JVD appreciated. Respiratory: No rhonchi or crepitations. Cardiovascular: S1 and S2 heard. No murmurs appreciated. Abdomen: Soft nontender bowel sounds present. No guarding or rigidity. Musculoskeletal: No edema. No joint effusion. Skin: No rash. Skin appears warm. Neurologic: Alert awake oriented to time place and person. Moves all extremities. Psychiatric: Appears normal. Normal affect.   Labs on Admission: I have personally reviewed following labs and imaging studies  CBC:  Recent Labs Lab 02/17/16 2256 02/18/16 0425  WBC 14.1* 12.1*  NEUTROABS 11.4*  --   HGB 10.0* 9.1*  HCT 30.6* 27.6*  MCV 89.5 88.2  PLT 294 283   Basic Metabolic Panel:  Recent Labs Lab 02/17/16 2256 02/18/16 0425  NA 134* 132*  K 3.7 3.4*  CL 99* 101  CO2 24 23  GLUCOSE 170* 148*  BUN 14 11  CREATININE 0.77 0.55  CALCIUM 8.8* 8.2*   GFR: Estimated Creatinine Clearance: 42.1 mL/min (by C-G formula based on SCr of 0.55 mg/dL). Liver Function Tests:  Recent Labs Lab 02/17/16 2256  AST 16  ALT 16  ALKPHOS 74  BILITOT 0.8  PROT 7.5  ALBUMIN 3.3*   No results for input(s): LIPASE, AMYLASE in the last 168 hours. No results for input(s): AMMONIA in the last 168 hours. Coagulation Profile: No results for input(s): INR, PROTIME in the last 168 hours. Cardiac Enzymes:  Recent Labs Lab 02/17/16 2256 02/18/16 0133  TROPONINI 0.03* <0.03   BNP (last 3 results) No results for input(s): PROBNP in the last 8760 hours. HbA1C: No results for input(s): HGBA1C in the last 72 hours. CBG: No results for input(s): GLUCAP in the last 168 hours. Lipid Profile: No results for input(s):  CHOL, HDL, LDLCALC, TRIG, CHOLHDL, LDLDIRECT in the last 72 hours. Thyroid Function Tests: No results for input(s): TSH, T4TOTAL, FREET4, T3FREE, THYROIDAB in the last 72 hours. Anemia Panel: No results for input(s): VITAMINB12, FOLATE, FERRITIN, TIBC, IRON, RETICCTPCT in the last 72 hours. Urine analysis:    Component Value Date/Time   COLORURINE YELLOW 11/01/2013 1633   APPEARANCEUR CLOUDY (A) 11/01/2013 1633   LABSPEC >1.030 (H) 11/01/2013 1633   PHURINE 6.0 11/01/2013 1633   GLUCOSEU NEG 11/01/2013 1633   HGBUR MOD (A) 11/01/2013 1633   BILIRUBINUR NEG 11/01/2013 1633   KETONESUR NEG 11/01/2013 1633   PROTEINUR 30 (A) 11/01/2013 1633   UROBILINOGEN 0.2 11/01/2013 1633   NITRITE NEG 11/01/2013 1633  LEUKOCYTESUR LARGE (A) 11/01/2013 1633   Sepsis Labs: @LABRCNTIP (procalcitonin:4,lacticidven:4) )No results found for this or any previous visit (from the past 240 hour(s)).   Radiological Exams on Admission: Dg Chest 2 View  Result Date: 02/17/2016 CLINICAL DATA:  Chest pain when breathing. Shortness of breath. Pneumonia for 2 days. History of myocardial infarct, bronchitis, former smoker, hypertension. EXAM: CHEST  2 VIEW COMPARISON:  None. FINDINGS: Moderate size left pleural effusion with atelectasis or consolidation in the left lung base. This may indicate pneumonia. Slight fibrosis or linear atelectasis in the right lung base. Central interstitial pattern with peribronchial thickening suggesting chronic bronchitis. No pneumothorax. Normal heart size and pulmonary vascularity. Calcified and tortuous aorta. Degenerative changes in the spine. Surgical clips in the upper abdomen. IMPRESSION: Consolidation or atelectasis in the left lung base with moderate left pleural effusion. Changes may indicate pneumonia. Followup PA and lateral chest X-ray is recommended in 3-4 weeks following appropriate clinical therapy to ensure resolution and exclude underlying malignancy. Electronically Signed    By: Burman NievesWilliam  Stevens M.D.   On: 02/17/2016 22:27    EKG: Independently reviewed. Sinus tachycardia.  Assessment/Plan Principal Problem:   HCAP (healthcare-associated pneumonia) Active Problems:   CAD (coronary artery disease)    CHF (congestive heart failure) (HCC)   Essential hypertension   Hyperlipidemia   Pneumonia    1. Healthcare associated pneumonia - since patient was recently admitted to the hospital in November 2017 we will keep patient on vancomycin and cefepime. Follow blood cultures sputum cultures and influenza PCR urine for Legionella and strep antigen. I will also place patient on Tamiflu until Flu results are available. Check d-dimer and cardiac markers. We'll get CT chest to make sure there is no loculation of the pleural effusion. 2. History of CHF - EF unknown. As per the patient patient was diagnosed with CHF last November 2017 and admitted to the hospital in [place]. Patient is on Lasix and Coreg which will be continued. During that admission patient also had a cardiac cath and patient states she did not have any stents placed. 3. Hyperlipidemia on statins. 4. Normocytic and normochromic anemia - follow CBC.   DVT prophylaxis: Lovenox. Code Status: Full code.  Family Communication: Discussed with patient.  Disposition Plan: Home.  Consults called: None.  Admission status: Inpatient.    Eduard ClosKAKRAKANDY,Kaci Dillie N. MD [facility] Pager [phone].  If 7PM-7AM, please contact night-coverage [email or web address] Password [email or web address]  02/18/2016, 5:41 AM

## 2016-02-18 ENCOUNTER — Inpatient Hospital Stay (HOSPITAL_COMMUNITY): Payer: Medicare Other

## 2016-02-18 ENCOUNTER — Encounter (HOSPITAL_COMMUNITY): Payer: Self-pay

## 2016-02-18 DIAGNOSIS — J90 Pleural effusion, not elsewhere classified: Secondary | ICD-10-CM

## 2016-02-18 DIAGNOSIS — E876 Hypokalemia: Secondary | ICD-10-CM

## 2016-02-18 DIAGNOSIS — I2699 Other pulmonary embolism without acute cor pulmonale: Secondary | ICD-10-CM

## 2016-02-18 DIAGNOSIS — E871 Hypo-osmolality and hyponatremia: Secondary | ICD-10-CM

## 2016-02-18 LAB — CBC
HEMATOCRIT: 27.6 % — AB (ref 36.0–46.0)
HEMOGLOBIN: 9.1 g/dL — AB (ref 12.0–15.0)
MCH: 29.1 pg (ref 26.0–34.0)
MCHC: 33 g/dL (ref 30.0–36.0)
MCV: 88.2 fL (ref 78.0–100.0)
Platelets: 283 10*3/uL (ref 150–400)
RBC: 3.13 MIL/uL — ABNORMAL LOW (ref 3.87–5.11)
RDW: 14.9 % (ref 11.5–15.5)
WBC: 12.1 10*3/uL — ABNORMAL HIGH (ref 4.0–10.5)

## 2016-02-18 LAB — BASIC METABOLIC PANEL
ANION GAP: 8 (ref 5–15)
BUN: 11 mg/dL (ref 6–20)
CHLORIDE: 101 mmol/L (ref 101–111)
CO2: 23 mmol/L (ref 22–32)
Calcium: 8.2 mg/dL — ABNORMAL LOW (ref 8.9–10.3)
Creatinine, Ser: 0.55 mg/dL (ref 0.44–1.00)
GFR calc Af Amer: >60 mL/min (ref >60)
GLUCOSE: 148 mg/dL — AB (ref 65–99)
POTASSIUM: 3.4 mmol/L — AB (ref 3.5–5.1)
Sodium: 132 mmol/L — ABNORMAL LOW (ref 135–145)

## 2016-02-18 LAB — HEPARIN LEVEL (UNFRACTIONATED): HEPARIN UNFRACTIONATED: 0.14 [IU]/mL — AB (ref 0.30–0.70)

## 2016-02-18 LAB — PROTIME-INR
INR: 1.22
PROTHROMBIN TIME: 15.5 s — AB (ref 11.4–15.2)

## 2016-02-18 LAB — APTT: APTT: 35 s (ref 24–36)

## 2016-02-18 LAB — TROPONIN I
Troponin I: <0.03 ng/mL (ref <0.03)
Troponin I: <0.03 ng/mL (ref <0.03)

## 2016-02-18 LAB — HIV ANTIBODY (ROUTINE TESTING W REFLEX): HIV Screen 4th Generation wRfx: NONREACTIVE

## 2016-02-18 LAB — INFLUENZA PANEL BY PCR (TYPE A & B)
INFLBPCR: NEGATIVE
Influenza A By PCR: NEGATIVE

## 2016-02-18 LAB — STREP PNEUMONIAE URINARY ANTIGEN: Strep Pneumo Urinary Antigen: NEGATIVE

## 2016-02-18 MED ORDER — IOPAMIDOL (ISOVUE-370) INJECTION 76%
INTRAVENOUS | Status: AC
  Filled 2016-02-18: qty 100

## 2016-02-18 MED ORDER — IOPAMIDOL (ISOVUE-370) INJECTION 76%
100.0000 mL | Freq: Once | INTRAVENOUS | Status: AC | PRN
  Administered 2016-02-18: 100 mL via INTRAVENOUS

## 2016-02-18 MED ORDER — POTASSIUM CHLORIDE CRYS ER 20 MEQ PO TBCR
40.0000 meq | EXTENDED_RELEASE_TABLET | Freq: Once | ORAL | Status: AC
  Administered 2016-02-18: 40 meq via ORAL
  Filled 2016-02-18: qty 2

## 2016-02-18 MED ORDER — SODIUM CHLORIDE 0.9 % IV SOLN
500.0000 mg | Freq: Two times a day (BID) | INTRAVENOUS | Status: DC
  Administered 2016-02-18 – 2016-02-20 (×4): 500 mg via INTRAVENOUS
  Filled 2016-02-18 (×5): qty 500

## 2016-02-18 MED ORDER — HEPARIN (PORCINE) IN NACL 100-0.45 UNIT/ML-% IJ SOLN
850.0000 [IU]/h | INTRAMUSCULAR | Status: DC
  Administered 2016-02-18: 850 [IU]/h via INTRAVENOUS
  Filled 2016-02-18: qty 250

## 2016-02-18 MED ORDER — DEXTROSE 5 % IV SOLN
1.0000 g | Freq: Two times a day (BID) | INTRAVENOUS | Status: DC
  Administered 2016-02-18 – 2016-02-20 (×4): 1 g via INTRAVENOUS
  Filled 2016-02-18 (×5): qty 1

## 2016-02-18 MED ORDER — OSELTAMIVIR PHOSPHATE 30 MG PO CAPS: 30.0000 mg | ORAL_CAPSULE | Freq: Two times a day (BID) | ORAL | Status: DC

## 2016-02-18 MED ORDER — HEPARIN (PORCINE) IN NACL 100-0.45 UNIT/ML-% IJ SOLN
1100.0000 [IU]/h | INTRAMUSCULAR | Status: DC
  Administered 2016-02-19: 1100 [IU]/h via INTRAVENOUS
  Filled 2016-02-18 (×3): qty 250

## 2016-02-18 MED ORDER — FUROSEMIDE 10 MG/ML IJ SOLN
20.0000 mg | Freq: Two times a day (BID) | INTRAMUSCULAR | Status: DC
  Administered 2016-02-18 – 2016-02-20 (×4): 20 mg via INTRAVENOUS
  Filled 2016-02-18 (×4): qty 2

## 2016-02-18 NOTE — Progress Notes (Signed)
Pharmacy Antibiotic Note  Kristen Lewis is a 83 y.o. female admitted on 02/17/2016 with pneumonia.  Pharmacy has been consulted for Vancomycin dosing.  Plan: Vancomycin 500mg  IV every 12 hours.  Goal trough 15-20 mcg/mL.  Height: 5\' 2"  (157.5 cm) Weight: 136 lb 7.4 oz (61.9 kg) IBW/kg (Calculated) : 50.1  Temp (24hrs), Avg:98.8 F (37.1 C), Min:98.3 F (36.8 C), Max:99.3 F (37.4 C)   Recent Labs Lab 02/17/16 2256 02/18/16 0425  WBC 14.1* 12.1*  CREATININE 0.77 0.55    Estimated Creatinine Clearance: 42.1 mL/min (by C-G formula based on SCr of 0.55 mg/dL).    No Known Allergies  Antimicrobials this admission: Vancomycin 02/18/2016 >> Cefepime 02/18/2016 >>   Dose adjustments this admission: -  Microbiology results: Pending   Thank you for allowing pharmacy to be a part of this patient's care.  Kristen Lewis, Kristen Lewis 02/18/2016 6:41 AM

## 2016-02-18 NOTE — Progress Notes (Signed)
ANTICOAGULATION CONSULT NOTE - Initial Consult  Pharmacy Consult for Heparin Indication: pulmonary embolus  No Known Allergies  Patient Measurements: Height: 5\' 2"  (157.5 cm) Weight: 136 lb 7.4 oz (61.9 kg) IBW/kg (Calculated) : 50.1 Heparin Dosing Weight:   Vital Signs: Temp: 98.9 F (37.2 C) (01/09 1430) Temp Source: Oral (01/09 1430) BP: 107/67 (01/09 1430) Pulse Rate: 88 (01/09 1430)  Labs:  Recent Labs  02/17/16 2256 02/18/16 0133 02/18/16 0425 02/18/16 0802 02/18/16 1552  HGB 10.0*  --  9.1*  --   --   HCT 30.6*  --  27.6*  --   --   PLT 294  --  283  --   --   APTT  --   --   --  35  --   LABPROT  --   --   --  15.5*  --   INR  --   --   --  1.22  --   HEPARINUNFRC  --   --   --   --  0.14*  CREATININE 0.77  --  0.55  --   --   TROPONINI 0.03* <0.03  --  <0.03 <0.03    Estimated Creatinine Clearance: 42.1 mL/min (by C-G formula based on SCr of 0.55 mg/dL).   Medical History: Past Medical History:  Diagnosis Date  . Arthritis   . Elevated cholesterol   . Hearing deficit   . Hypertension   . Vaginal pessary present     Medications:  Infusions:  . heparin      Assessment: Patient with new PE on CT.  No oral anticoagulants noted on med rec.  Baseline coags ordered.  Enoxaparin given 1/8 PM, therefore no bolus.  02/18/16  Heparin level = 0.14 on heparin @ 850 units/hr  No bleeding complications noted  Goal of Therapy:  Heparin level 0.3-0.7 units/ml Monitor platelets by anticoagulation protocol: Yes   Plan:  Increase Heparin drip to 1000 units/hr Check heparin level 8 hr after rate increase Daily CBC  Lynley Killilea, Joselyn GlassmanLeann Trefz, PharmD 02/18/2016,5:55 PM

## 2016-02-18 NOTE — Progress Notes (Signed)
Nutrition Brief Note  Patient identified on the Malnutrition Screening Tool (MST) Report  Patient reports having a good appetite and eating well PTA. She states today she is having trouble eating, her food is cold after she needed assistance to the restroom. UBW is 138 lb. Weight is stable at this time.   Wt Readings from Last 15 Encounters:  02/17/16 136 lb 7.4 oz (61.9 kg)  11/05/14 145 lb (65.8 kg)  05/03/14 138 lb (62.6 kg)  11/01/13 138 lb (62.6 kg)  07/05/12 141 lb (64 kg)    Body mass index is 24.96 kg/m. Patient meets criteria for normal based on current BMI.   Current diet order is Heart Healthy. Labs and medications reviewed.   No nutrition interventions warranted at this time. If nutrition issues arise, please consult RD.   Tilda FrancoLindsey Dashanti Burr, MS, RD, LDN Pager: [phone] After Hours Pager: [phone]

## 2016-02-18 NOTE — Progress Notes (Signed)
PROGRESS NOTE    Kristen Lewis  ZOX:[id number] DOB: 1925-12-12 DOA: 02/17/2016 PCP: Gaspar Garbe, MD  Brief Narrative:  Kristen Lewis is a 83 y.o. female with history of hypertension, CHF, and other comorbids who has been experiencing chest pain on deep inspiration last 2 days. Patient also has been having some non-productive cough with subjective feeling of fever and chills. Patient went to the urgent care center. At the urgent care center patient was recorded to have a temperature of 100.91F. Labs done showed WBC of 15,000 with chest x-ray showing left lower lobe infiltrates and effusion. Since patient has persistent pleuritic chest pain patient was admitted directly for further management of pneumonia. Patient states in November 2017 2 months ago patient had been admitted to the hospital in [place] for CHF and at that time patient also had undergone cardiac cath but did not have any stents placed. Since then patient has been on Lasix Coreg aspirin and statin. Patient does get short of breath on exertion sometimes which improves with Lasix. Was worked up and found to have Small Right Pe, Large Left Pleural Effusion, and Possible CHF Exacerbation and HCAP.   Assessment & Plan:   Principal Problem:   HCAP (healthcare-associated pneumonia) Active Problems:   CAD (coronary artery disease)    CHF (congestive heart failure) (HCC)   Essential hypertension   Hyperlipidemia   Pneumonia  Acute Small Right Upper Lobe Posterior Segmental Artery Pulmonary Embolus -CT showed There is a small filling defect within the right upper lobe posterior segmental pulmonary artery. No other pulmonary emboli are identified. The main pulmonary artery is within normal limits for size. There is no CT evidence of acute right heart strain -D-Dimer was 1.83 -C/w Heparin gtt  Large Left Sided Pleural Effusion and Small Right Pleural Effusion -On CT Angio PE showed large left and small right pleural  effusions with associated Atelectasis. -Unknown if Pleural Effusion is from Pneumonia vs. CHF -Will Consult IR for Management and Evaluation for Thoracentesis  -Hold Home Furosemide 40 mg po daily and start IV Furosemide 20 mg q12h  Suspected Healthcare Associated Pneumonia -CXR showed Moderate size left pleural effusion with atelectasis or consolidation in the left lung base. This may indicate pneumonia. Slight fibrosis or linear atelectasis in the right lung base. Central interstitial pattern with peribronchial thickening suggesting chronic bronchitis. No pneumothorax. Normal heart size and pulmonary vascularity. Calcified and tortuous aorta. Degenerative changes in the spine. Surgical clips in the upper abdomen. -CT Angio of Chest PE showed There are large left and small right pleural effusions. There is moderate collapse of the left lower lobe and platelike atelectasis within the lingula. ?pneumonia with Elevated WBC went from 14.1 -> 12.1 -Recently admitted to the hospital in November 2017 -C/w IV Vancomycin and Cefepime.  -Follow Blood Cultures and Sputum cultures  -Influenza A/B by PCR Negative; So Droplet Precautions and Oseltamivir Discontinued -urine for Legionella and strep antigen.   History of CHF - EF unknown.  -As per the patient patient was diagnosed with CHF last November 2017 and admitted to the hospital in [place].  -During that admission patient also had a cardiac cath and patient states she did not have any stents placed. -Appeared Slightly Volume overloaded -Will get ECHOCardiogram -Strict Is and Os, Daily Weights, and Fluid Restriction -BNP was 949 -IV Furosemide 20 mg q12h and will hold patient's Home po dose -C/w Coreg 6.25 mg po BID  CAD -Initial Troponin elevated at 0.03; Subsequent <0.03 -On  Nitrodur 0.4 mg TD Nitro Patch -C/w NTG 0.4 mg SL q74minprn, Coreg 6.25 mg po BID, and Atorvastatin 10 mg po Daily -Repeat EKG in AM  Hypokalemia -Patient's K+ went  from 3.7 -> 3.4 -Replete -Repeat CMP in AM  Hyponatremia -Patient's Na+ went from 134 -> 132 -? Hypervolemic Hyponatremia -Repeat CMP in AM  Hyperlipidemia -Continue Atorvastatin 10 mg po Daily  Normocytic and normochromic Anemia -Patient's Hb/Hct went from 10.0/30.6 -> 9.1/27.6 -Repeat CBC in AM  DVT prophylaxis: Heparin gtt Code Status: FULL CODE Family Communication: No Family present at Bedside Disposition Plan: Remain Inpatient and Home in 1-2 days likely  Consultants:   None   Procedures: None   Antimicrobials: IV Vancomycin and IV Cefepime  Subjective: Seen and examined at bedside and stated she was still SOB and stated it hurt to take a deep breath. Recently had a long trip from [place] in a car-ride. Denied any current Cp. Had no dizziness or lightheadedness but feels a little bit better on O2. No other concerns or complaints at this time and is agreeable for Thoracentesis.   Objective: Vitals:   02/17/16 2055 02/18/16 0453  BP: (!) 147/80 123/76  Pulse: 85 (!) 102  Resp: 18 16  Temp: 98.3 F (36.8 C) 99.3 F (37.4 C)  TempSrc: Oral Oral  SpO2: 100% 98%  Weight: 61.9 kg (136 lb 7.4 oz)   Height: 5\' 2"  (1.575 m)     Intake/Output Summary (Last 24 hours) at 02/18/16 1514 Last data filed at 02/18/16 1126  Gross per 24 hour  Intake              120 ml  Output              950 ml  Net             -830 ml   Filed Weights   02/17/16 2055  Weight: 61.9 kg (136 lb 7.4 oz)   Examination: Physical Exam:  Constitutional: WN/WD, NAD and appears calm and comfortable Eyes: Lids and conjunctivae normal, sclerae anicteric  ENMT: External Ears, Nose appear normal. Grossly normal hearing.  Neck: Appears normal, supple, no cervical masses, normal ROM, no appreciable thyromegaly, mild JVD Respiratory: Moderately diminished with some wheezing and wet crackles on Left lower lobe.  Normal respiratory effort and patient is not tachypenic. No accessory muscle use  wearing O2 via [place].  Cardiovascular: RRR, no murmurs / rubs / gallops. S1 and S2 auscultated. 1+ extremity edema.   Abdomen: Soft, non-tender, non-distended. No masses palpated. No appreciable hepatosplenomegaly. Bowel sounds positive x4.  GU: Deferred. Musculoskeletal: No clubbing / cyanosis of digits/nails. No joint deformity upper and lower extremities.  Skin: No rashes, lesions, ulcers. No induration; Warm and dry.  Neurologic: CN 2-12 grossly intact with no focal deficits. Sensation intact in all 4 Extremities. Romberg sign cerebellar reflexes not assessed.  Psychiatric: Normal judgment and insight. Alert and oriented x 3. Normal mood and appropriate affect.   Data Reviewed: I have personally reviewed following labs and imaging studies  CBC:  Recent Labs Lab 02/17/16 2256 02/18/16 0425  WBC 14.1* 12.1*  NEUTROABS 11.4*  --   HGB 10.0* 9.1*  HCT 30.6* 27.6*  MCV 89.5 88.2  PLT 294 283   Basic Metabolic Panel:  Recent Labs Lab 02/17/16 2256 02/18/16 0425  NA 134* 132*  K 3.7 3.4*  CL 99* 101  CO2 24 23  GLUCOSE 170* 148*  BUN 14 11  CREATININE 0.77 0.55  CALCIUM 8.8* 8.2*   GFR: Estimated Creatinine Clearance: 42.1 mL/min (by C-G formula based on SCr of 0.55 mg/dL). Liver Function Tests:  Recent Labs Lab 02/17/16 2256  AST 16  ALT 16  ALKPHOS 74  BILITOT 0.8  PROT 7.5  ALBUMIN 3.3*   No results for input(s): LIPASE, AMYLASE in the last 168 hours. No results for input(s): AMMONIA in the last 168 hours. Coagulation Profile:  Recent Labs Lab 02/18/16 0802  INR 1.22   Cardiac Enzymes:  Recent Labs Lab 02/17/16 2256 02/18/16 0133 02/18/16 0802  TROPONINI 0.03* <0.03 <0.03   BNP (last 3 results) No results for input(s): PROBNP in the last 8760 hours. HbA1C: No results for input(s): HGBA1C in the last 72 hours. CBG: No results for input(s): GLUCAP in the last 168 hours. Lipid Profile: No results for input(s): CHOL, HDL, LDLCALC, TRIG, CHOLHDL,  LDLDIRECT in the last 72 hours. Thyroid Function Tests: No results for input(s): TSH, T4TOTAL, FREET4, T3FREE, THYROIDAB in the last 72 hours. Anemia Panel: No results for input(s): VITAMINB12, FOLATE, FERRITIN, TIBC, IRON, RETICCTPCT in the last 72 hours. Sepsis Labs: No results for input(s): PROCALCITON, LATICACIDVEN in the last 168 hours.  No results found for this or any previous visit (from the past 240 hour(s)).   Radiology Studies: Dg Chest 2 View  Result Date: 02/17/2016 CLINICAL DATA:  Chest pain when breathing. Shortness of breath. Pneumonia for 2 days. History of myocardial infarct, bronchitis, former smoker, hypertension. EXAM: CHEST  2 VIEW COMPARISON:  None. FINDINGS: Moderate size left pleural effusion with atelectasis or consolidation in the left lung base. This may indicate pneumonia. Slight fibrosis or linear atelectasis in the right lung base. Central interstitial pattern with peribronchial thickening suggesting chronic bronchitis. No pneumothorax. Normal heart size and pulmonary vascularity. Calcified and tortuous aorta. Degenerative changes in the spine. Surgical clips in the upper abdomen. IMPRESSION: Consolidation or atelectasis in the left lung base with moderate left pleural effusion. Changes may indicate pneumonia. Followup PA and lateral chest X-ray is recommended in 3-4 weeks following appropriate clinical therapy to ensure resolution and exclude underlying malignancy. Electronically Signed   By: Burman Nieves M.D.   On: 02/17/2016 22:27   Ct Angio Chest Pe W Or Wo Contrast  Result Date: 02/18/2016 CLINICAL DATA:  Shortness of breath EXAM: CT ANGIOGRAPHY CHEST WITH CONTRAST TECHNIQUE: Multidetector CT imaging of the chest was performed using the standard protocol during bolus administration of intravenous contrast. Multiplanar CT image reconstructions and MIPs were obtained to evaluate the vascular anatomy. CONTRAST:  100 mL Isovue 370 IV COMPARISON:  Chest radiograph  02/17/2016 FINDINGS: Cardiovascular: Contrast injection is sufficient to demonstrate satisfactory opacification of the pulmonary arteries to the segmental level. There is a small filling defect within the right upper lobe posterior segmental pulmonary artery (series 5 image 85, series 9 image 62). No other pulmonary emboli are identified. The main pulmonary artery is within normal limits for size. There is no CT evidence of acute right heart strain. There is atherosclerotic calcification of the aorta. Coronary artery calcifications are also noted. There is a normal 3-vessel arch branching pattern. Heart size is normal. There is a small pericardial effusion, measuring up to 6 mm in thickness. Mediastinum/Nodes: No mediastinal, hilar or axillary lymphadenopathy. The visualized thyroid and thoracic esophageal course are unremarkable. Lungs/Pleura: There are large left and small right pleural effusions. There is moderate collapse of the left lower lobe and platelike atelectasis within the lingula. Upper Abdomen: Contrast bolus timing is not optimized  for evaluation of the abdominal organs. Within this limitation, the visualized organs of the upper abdomen are normal. Musculoskeletal: No chest wall abnormality. No acute or significant osseous findings. Review of the MIP images confirms the above findings. IMPRESSION: 1. Small right upper lobe posterior segmental artery pulmonary embolus. No evidence of right heart strain. 2. Aortic and coronary artery atherosclerosis. 3. Small pericardial effusion. 4. Large left and small right pleural effusions with associated atelectasis. These results will be called to the ordering clinician or representative by the Radiologist Assistant, and communication documented in the PACS or zVision Dashboard. Electronically Signed   By: Deatra RobinsonKevin  Herman M.D.   On: 02/18/2016 05:42   Scheduled Meds: . atorvastatin  10 mg Oral q1800  . carvedilol  6.25 mg Oral BID WC  . ceFEPime (MAXIPIME) IV   1 g Intravenous Q12H  . furosemide  40 mg Oral Daily  . iopamidol      . nitroGLYCERIN  0.4 mg Transdermal Daily  . oseltamivir  30 mg Oral BID  . PARoxetine  20 mg Oral Daily  . vancomycin  500 mg Intravenous Q12H   Continuous Infusions: . heparin 850 Units/hr (02/18/16 0653)    LOS: 1 day   Merlene Laughtermair Latif Sheikh, DO [facility] Pager [phone]  If 7PM-7AM, please contact night-coverage [email or web address] Password [email or web address] 02/18/2016, 3:14 PM

## 2016-02-18 NOTE — Progress Notes (Signed)
ANTICOAGULATION CONSULT NOTE - Initial Consult  Pharmacy Consult for Heparin Indication: pulmonary embolus  No Known Allergies  Patient Measurements: Height: 5\' 2"  (157.5 cm) Weight: 136 lb 7.4 oz (61.9 kg) IBW/kg (Calculated) : 50.1 Heparin Dosing Weight:   Vital Signs: Temp: 99.3 F (37.4 C) (01/09 0453) Temp Source: Oral (01/09 0453) BP: 123/76 (01/09 0453) Pulse Rate: 102 (01/09 0453)  Labs:  Recent Labs  02/17/16 2256 02/18/16 0133 02/18/16 0425  HGB 10.0*  --  9.1*  HCT 30.6*  --  27.6*  PLT 294  --  283  CREATININE 0.77  --  0.55  TROPONINI 0.03* <0.03  --     Estimated Creatinine Clearance: 42.1 mL/min (by C-G formula based on SCr of 0.55 mg/dL).   Medical History: Past Medical History:  Diagnosis Date  . Arthritis   . Elevated cholesterol   . Hearing deficit   . Hypertension   . Vaginal pessary present     Medications:  Infusions:  . heparin      Assessment: Patient with new PE on CT.  No oral anticoagulants noted on med rec.  Baseline coags ordered.  Enoxaparin given 1/8 PM, therefore no bolus.  Goal of Therapy:  Heparin level 0.3-0.7 units/ml Monitor platelets by anticoagulation protocol: Yes   Plan:  Heparin drip at 850 units/hr Daily CBC Next heparin level at 1600    [address] Edgewater DriveGrimsley Jr, JordanJulian Crowford 02/18/2016,6:27 AM

## 2016-02-19 ENCOUNTER — Inpatient Hospital Stay (HOSPITAL_COMMUNITY): Payer: Medicare Other

## 2016-02-19 DIAGNOSIS — E871 Hypo-osmolality and hyponatremia: Secondary | ICD-10-CM

## 2016-02-19 DIAGNOSIS — J90 Pleural effusion, not elsewhere classified: Secondary | ICD-10-CM

## 2016-02-19 DIAGNOSIS — I2699 Other pulmonary embolism without acute cor pulmonale: Principal | ICD-10-CM

## 2016-02-19 DIAGNOSIS — I10 Essential (primary) hypertension: Secondary | ICD-10-CM

## 2016-02-19 DIAGNOSIS — E785 Hyperlipidemia, unspecified: Secondary | ICD-10-CM

## 2016-02-19 DIAGNOSIS — I2510 Atherosclerotic heart disease of native coronary artery without angina pectoris: Secondary | ICD-10-CM

## 2016-02-19 DIAGNOSIS — I509 Heart failure, unspecified: Secondary | ICD-10-CM

## 2016-02-19 DIAGNOSIS — J189 Pneumonia, unspecified organism: Secondary | ICD-10-CM

## 2016-02-19 DIAGNOSIS — R0600 Dyspnea, unspecified: Secondary | ICD-10-CM

## 2016-02-19 DIAGNOSIS — E876 Hypokalemia: Secondary | ICD-10-CM

## 2016-02-19 LAB — PROTEIN, BODY FLUID: TOTAL PROTEIN, FLUID: 3.5 g/dL

## 2016-02-19 LAB — CBC WITH DIFFERENTIAL/PLATELET
BASOS ABS: 0 10*3/uL (ref 0.0–0.1)
Basophils Relative: 0 %
EOS ABS: 0.3 10*3/uL (ref 0.0–0.7)
EOS PCT: 2 %
HCT: 25.9 % — ABNORMAL LOW (ref 36.0–46.0)
Hemoglobin: 8.5 g/dL — ABNORMAL LOW (ref 12.0–15.0)
Lymphocytes Relative: 14 %
Lymphs Abs: 1.7 10*3/uL (ref 0.7–4.0)
MCH: 29 pg (ref 26.0–34.0)
MCHC: 32.8 g/dL (ref 30.0–36.0)
MCV: 88.4 fL (ref 78.0–100.0)
Monocytes Absolute: 1.3 10*3/uL — ABNORMAL HIGH (ref 0.1–1.0)
Monocytes Relative: 10 %
Neutro Abs: 9 10*3/uL — ABNORMAL HIGH (ref 1.7–7.7)
Neutrophils Relative %: 74 %
PLATELETS: 282 10*3/uL (ref 150–400)
RBC: 2.93 MIL/uL — AB (ref 3.87–5.11)
RDW: 14.9 % (ref 11.5–15.5)
WBC: 12.2 10*3/uL — AB (ref 4.0–10.5)

## 2016-02-19 LAB — GRAM STAIN

## 2016-02-19 LAB — HEPARIN LEVEL (UNFRACTIONATED)
HEPARIN UNFRACTIONATED: 0.23 [IU]/mL — AB (ref 0.30–0.70)
Heparin Unfractionated: 0.31 IU/mL (ref 0.30–0.70)

## 2016-02-19 LAB — COMPREHENSIVE METABOLIC PANEL
ALT: 15 U/L (ref 14–54)
AST: 16 U/L (ref 15–41)
Albumin: 2.6 g/dL — ABNORMAL LOW (ref 3.5–5.0)
Alkaline Phosphatase: 65 U/L (ref 38–126)
Anion gap: 8 (ref 5–15)
BUN: 13 mg/dL (ref 6–20)
CHLORIDE: 101 mmol/L (ref 101–111)
CO2: 25 mmol/L (ref 22–32)
CREATININE: 0.86 mg/dL (ref 0.44–1.00)
Calcium: 8.4 mg/dL — ABNORMAL LOW (ref 8.9–10.3)
GFR calc non Af Amer: 59 mL/min — ABNORMAL LOW (ref >60)
Glucose, Bld: 123 mg/dL — ABNORMAL HIGH (ref 65–99)
Potassium: 3.9 mmol/L (ref 3.5–5.1)
SODIUM: 134 mmol/L — AB (ref 135–145)
Total Bilirubin: 0.7 mg/dL (ref 0.3–1.2)
Total Protein: 6.1 g/dL — ABNORMAL LOW (ref 6.5–8.1)

## 2016-02-19 LAB — MAGNESIUM: Magnesium: 1.7 mg/dL (ref 1.7–2.4)

## 2016-02-19 LAB — IRON AND TIBC
Iron: 6 ug/dL — ABNORMAL LOW (ref 28–170)
SATURATION RATIOS: 3 % — AB (ref 10.4–31.8)
TIBC: 193 ug/dL — ABNORMAL LOW (ref 250–450)
UIBC: 187 ug/dL

## 2016-02-19 LAB — BODY FLUID CELL COUNT WITH DIFFERENTIAL
Eos, Fluid: 0 %
Lymphs, Fluid: 32 %
MONOCYTE-MACROPHAGE-SEROUS FLUID: 33 % — AB (ref 50–90)
Neutrophil Count, Fluid: 35 % — ABNORMAL HIGH (ref 0–25)
WBC FLUID: 1049 uL — AB (ref 0–1000)

## 2016-02-19 LAB — RETICULOCYTES
RBC.: 2.86 MIL/uL — ABNORMAL LOW (ref 3.87–5.11)
RETIC COUNT ABSOLUTE: 51.5 10*3/uL (ref 19.0–186.0)
Retic Ct Pct: 1.8 % (ref 0.4–3.1)

## 2016-02-19 LAB — PHOSPHORUS: PHOSPHORUS: 3.2 mg/dL (ref 2.5–4.6)

## 2016-02-19 LAB — ECHOCARDIOGRAM COMPLETE
HEIGHTINCHES: 62 in
WEIGHTICAEL: 2211.2 [oz_av]

## 2016-02-19 LAB — VITAMIN B12: VITAMIN B 12: 546 pg/mL (ref 180–914)

## 2016-02-19 LAB — LACTATE DEHYDROGENASE, PLEURAL OR PERITONEAL FLUID: LD, Fluid: 79 U/L — ABNORMAL HIGH (ref 3–23)

## 2016-02-19 LAB — FOLATE: Folate: 19.8 ng/mL (ref >5.9)

## 2016-02-19 LAB — FERRITIN: FERRITIN: 221 ng/mL (ref 11–307)

## 2016-02-19 MED ORDER — HEPARIN (PORCINE) IN NACL 100-0.45 UNIT/ML-% IJ SOLN
1250.0000 [IU]/h | INTRAMUSCULAR | Status: DC
  Administered 2016-02-19: 1250 [IU]/h via INTRAVENOUS
  Filled 2016-02-19: qty 250

## 2016-02-19 NOTE — Progress Notes (Addendum)
ANTICOAGULATION CONSULT NOTE   Pharmacy Consult for Heparin Indication: pulmonary embolus  No Known Allergies  Patient Measurements: Height: 5\' 2"  (157.5 cm) Weight: 138 lb 3.2 oz (62.7 kg) IBW/kg (Calculated) : 50.1 Heparin Dosing Weight:   Vital Signs: Temp: 98.6 F (37 C) (01/10 1516) Temp Source: Oral (01/10 1516) BP: 101/67 (01/10 1516) Pulse Rate: 90 (01/10 1516)  Labs:  Recent Labs  02/17/16 2256 02/18/16 0133 02/18/16 0425 02/18/16 0802 02/18/16 1552 02/19/16 0140 02/19/16 1114  HGB 10.0*  --  9.1*  --   --  8.5*  --   HCT 30.6*  --  27.6*  --   --  25.9*  --   PLT 294  --  283  --   --  282  --   APTT  --   --   --  35  --   --   --   LABPROT  --   --   --  15.5*  --   --   --   INR  --   --   --  1.22  --   --   --   HEPARINUNFRC  --   --   --   --  0.14* 0.31 0.23*  CREATININE 0.77  --  0.55  --   --  0.86  --   TROPONINI 0.03* <0.03  --  <0.03 <0.03  --   --     Estimated Creatinine Clearance: 39.3 mL/min (by C-G formula based on SCr of 0.86 mg/dL).   Medical History: Past Medical History:  Diagnosis Date  . Arthritis   . Elevated cholesterol   . Hearing deficit   . Hypertension   . Vaginal pessary present     Medications:  Infusions:  . heparin Stopped (02/19/16 1122)    Assessment: Patient with new PE on CT.  No oral anticoagulants noted on med rec.  Baseline coags ordered.  Enoxaparin given 1/8 PM, therefore no bolus.  02/19/16  Heparin level = 0.23 on heparin @ 1100 units/hr, level drawn before drip turned off  No bleeding complications noted  02/19/2016 3:35 PM Now s/p thoracentesis, per IR, may resume heparin at 16:00 if stable (CXR = No pneumothorax post left thoracentesis. Improved aeration in the left lung base; Progression of right lower lobe airspace disease. Small right effusion unchanged.)  Goal of Therapy:  Heparin level 0.3-0.7 units/ml Monitor platelets by anticoagulation protocol: Yes   Plan:  -At 16:00, resume  heparin at 1250 units/hr (no bolus) -Check heparin level 8hr after resuming heparin (midnight) -Daily CBC, heparin level -Follow up plans for transition to oral anticoagulation  Loralee PacasErin Anthony Roland, PharmD, BCPS Pager: [phone] 02/19/2016, 3:30 PM

## 2016-02-19 NOTE — Progress Notes (Signed)
  Echocardiogram 2D Echocardiogram has been performed.  Kristen Lewis, Kristen Lewis 02/19/2016, 3:09 PM

## 2016-02-19 NOTE — Progress Notes (Signed)
PROGRESS NOTE    Kristen Lewis  NWG:[id number] DOB: February 08, 1926 DOA: 02/17/2016 PCP: Gaspar Garbe, MD   Brief Narrative: Kristen Lewis is a 83 y.o. femalewith history of hypertension, CHF, and other comorbids who has been experiencing chest pain on deep inspiration last 2 days. Patient also has been having some non-productive cough with subjective feeling of fever and chills. Patient went to the urgent care center.At the urgent care center patient was recorded to have a temperature of 100.63F. Labs done showed WBC of 15,000 with chest x-ray showing left lower lobe infiltrates and effusion. Since patient has persistent pleuritic chest pain patient was admitted directly for further management of pneumonia. Patient states in November 2017 2 months ago patient had been admitted to the hospital in [place] for CHF and at that time patient also had undergone cardiac cath but did not have any stents placed. Since then patient has been on Lasix Coreg aspirin and statin. Patient does get short of breath on exertion sometimes which improves with Lasix. Was worked up and found to have Small Right Pe, Large Left Pleural Effusion, and Possible CHF Exacerbation and HCAP.    Assessment & Plan:   Principal Problem:   HCAP (healthcare-associated pneumonia) Active Problems:   CAD (coronary artery disease)    CHF (congestive heart failure) (HCC)   Essential hypertension   Hyperlipidemia   Pneumonia   Pulmonary embolus (HCC)   Pleural effusion   Hypokalemia   Hyponatremia   Healthcare associated pneumonia Probable diagnosis. Patient still on oxygen. Flu negative. Blood culture no growth x1 day. Urine strep negative. -urine legionella pending -sputum culture/gram stain -continue vancomycin and cefepime  Acute respiratory failure with hypoxia Possibly secondary to HCAP vs pleural effusion vs acute PE -continue O2 via [place] and wean to room air as tolerated  Acute pulmonary  embolism Right upper lobe -continue heparin drip -will likely transition to DOAC in AM  CHF Appears compensated on exam, although she does have pleural effusions. The fact that left is much greater than right makes me think it is likely less from a cardiac etiology -follow-up on echocardiogram -weights -in/out -continue lasix 20mg  q12 hrs -continue coreg 6.25mg  BID  CAD Patient states she had a heart attack in November with a negative cath. Troponin elevated very slightly and decreased. Chest pain sounds pleuritic and only present with deep inspiration -continue coreg -continue atorvastatin  Hypokalemia Resolved  Hyperlipidemia On statin. Adherent with regimen.  -continue atorvastatin  Hyponatremia Improving -BMP in AM  Normocytic anemia Stable -anemia panel    DVT prophylaxis: Heparin gtt Code Status: Full code Family Communication: None at bedside Disposition Plan: Anticipate discharge in 2-3 days   Consultants:   Interventional radiology  Procedures:   Thoracentesis (1/10)  Antimicrobials:   None    Subjective: Patient reports feeling better today. Dyspnea and chest pain are improved but present. No lower extremity edema.  Objective: Vitals:   02/18/16 1430 02/18/16 2051 02/19/16 0517 02/19/16 1400  BP: 107/67 109/67 (!) 106/52 106/66  Pulse: 88 80 80 86  Resp: 18 20 18 16   Temp: 98.9 F (37.2 C) 98.6 F (37 C) 98.2 F (36.8 C) 98.3 F (36.8 C)  TempSrc: Oral Oral Oral Oral  SpO2: 98% 99% 98% 97%  Weight:   62.7 kg (138 lb 3.2 oz)   Height:        Intake/Output Summary (Last 24 hours) at 02/19/16 1407 Last data filed at 02/19/16 1250  Gross per 24 hour  Intake              620 ml  Output             1450 ml  Net             -830 ml   Filed Weights   02/17/16 2055 02/19/16 0517  Weight: 61.9 kg (136 lb 7.4 oz) 62.7 kg (138 lb 3.2 oz)    Examination:  General exam: Appears calm and comfortable Respiratory system: Diminished  breath sounds bilaterally from midway down. Respiratory effort normal. Cardiovascular system: S1 & S2 heard, RRR. No murmurs, rubs, gallops or clicks. Gastrointestinal system: Abdomen is nondistended, soft and nontender. No organomegaly or masses felt. Normal bowel sounds heard. Central nervous system: Alert and oriented. No focal neurological deficits. Extremities: No edema. No calf tenderness Skin: No cyanosis. No rashes Psychiatry: Judgement and insight appear normal. Mood & affect appropriate.     Data Reviewed: I have personally reviewed following labs and imaging studies  CBC:  Recent Labs Lab 02/17/16 2256 02/18/16 0425 02/19/16 0140  WBC 14.1* 12.1* 12.2*  NEUTROABS 11.4*  --  9.0*  HGB 10.0* 9.1* 8.5*  HCT 30.6* 27.6* 25.9*  MCV 89.5 88.2 88.4  PLT 294 283 282   Basic Metabolic Panel:  Recent Labs Lab 02/17/16 2256 02/18/16 0425 02/19/16 0140  NA 134* 132* 134*  K 3.7 3.4* 3.9  CL 99* 101 101  CO2 24 23 25   GLUCOSE 170* 148* 123*  BUN 14 11 13   CREATININE 0.77 0.55 0.86  CALCIUM 8.8* 8.2* 8.4*  MG  --   --  1.7  PHOS  --   --  3.2   GFR: Estimated Creatinine Clearance: 39.3 mL/min (by C-G formula based on SCr of 0.86 mg/dL). Liver Function Tests:  Recent Labs Lab 02/17/16 2256 02/19/16 0140  AST 16 16  ALT 16 15  ALKPHOS 74 65  BILITOT 0.8 0.7  PROT 7.5 6.1*  ALBUMIN 3.3* 2.6*   No results for input(s): LIPASE, AMYLASE in the last 168 hours. No results for input(s): AMMONIA in the last 168 hours. Coagulation Profile:  Recent Labs Lab 02/18/16 0802  INR 1.22   Cardiac Enzymes:  Recent Labs Lab 02/17/16 2256 02/18/16 0133 02/18/16 0802 02/18/16 1552  TROPONINI 0.03* <0.03 <0.03 <0.03   BNP (last 3 results) No results for input(s): PROBNP in the last 8760 hours. HbA1C: No results for input(s): HGBA1C in the last 72 hours. CBG: No results for input(s): GLUCAP in the last 168 hours. Lipid Profile: No results for input(s): CHOL,  HDL, LDLCALC, TRIG, CHOLHDL, LDLDIRECT in the last 72 hours. Thyroid Function Tests: No results for input(s): TSH, T4TOTAL, FREET4, T3FREE, THYROIDAB in the last 72 hours. Anemia Panel: No results for input(s): VITAMINB12, FOLATE, FERRITIN, TIBC, IRON, RETICCTPCT in the last 72 hours. Sepsis Labs: No results for input(s): PROCALCITON, LATICACIDVEN in the last 168 hours.  Recent Results (from the past 240 hour(s))  Culture, blood (routine x 2)     Status: None (Preliminary result)   Collection Time: 02/17/16 10:56 PM  Result Value Ref Range Status   Specimen Description BLOOD RIGHT ANTECUBITAL  Final   Special Requests BOTTLES DRAWN AEROBIC AND ANAEROBIC  Final   Culture   Final    NO GROWTH 1 DAY Performed at [facility]    Report Status PENDING  Incomplete  Culture, blood (routine x 2)     Status: None (Preliminary result)   Collection Time:  02/17/16 10:56 PM  Result Value Ref Range Status   Specimen Description BLOOD LEFT ANTECUBITAL  Final   Special Requests BOTTLES DRAWN AEROBIC AND ANAEROBIC 5.5ML  Final   Culture   Final    NO GROWTH 1 DAY [facility]    Report Status PENDING  Incomplete         Radiology Studies: Dg Chest 2 View  Result Date: 02/17/2016 CLINICAL DATA:  Chest pain when breathing. Shortness of breath. Pneumonia for 2 days. History of myocardial infarct, bronchitis, former smoker, hypertension. EXAM: CHEST  2 VIEW COMPARISON:  None. FINDINGS: Moderate size left pleural effusion with atelectasis or consolidation in the left lung base. This may indicate pneumonia. Slight fibrosis or linear atelectasis in the right lung base. Central interstitial pattern with peribronchial thickening suggesting chronic bronchitis. No pneumothorax. Normal heart size and pulmonary vascularity. Calcified and tortuous aorta. Degenerative changes in the spine. Surgical clips in the upper abdomen. IMPRESSION: Consolidation or atelectasis in the left  lung base with moderate left pleural effusion. Changes may indicate pneumonia. Followup PA and lateral chest X-ray is recommended in 3-4 weeks following appropriate clinical therapy to ensure resolution and exclude underlying malignancy. Electronically Signed   By: Burman NievesWilliam  Stevens M.D.   On: 02/17/2016 22:27   Ct Angio Chest Pe W Or Wo Contrast  Result Date: 02/18/2016 CLINICAL DATA:  Shortness of breath EXAM: CT ANGIOGRAPHY CHEST WITH CONTRAST TECHNIQUE: Multidetector CT imaging of the chest was performed using the standard protocol during bolus administration of intravenous contrast. Multiplanar CT image reconstructions and MIPs were obtained to evaluate the vascular anatomy. CONTRAST:  100 mL Isovue 370 IV COMPARISON:  Chest radiograph 02/17/2016 FINDINGS: Cardiovascular: Contrast injection is sufficient to demonstrate satisfactory opacification of the pulmonary arteries to the segmental level. There is a small filling defect within the right upper lobe posterior segmental pulmonary artery (series 5 image 85, series 9 image 62). No other pulmonary emboli are identified. The main pulmonary artery is within normal limits for size. There is no CT evidence of acute right heart strain. There is atherosclerotic calcification of the aorta. Coronary artery calcifications are also noted. There is a normal 3-vessel arch branching pattern. Heart size is normal. There is a small pericardial effusion, measuring up to 6 mm in thickness. Mediastinum/Nodes: No mediastinal, hilar or axillary lymphadenopathy. The visualized thyroid and thoracic esophageal course are unremarkable. Lungs/Pleura: There are large left and small right pleural effusions. There is moderate collapse of the left lower lobe and platelike atelectasis within the lingula. Upper Abdomen: Contrast bolus timing is not optimized for evaluation of the abdominal organs. Within this limitation, the visualized organs of the upper abdomen are normal.  Musculoskeletal: No chest wall abnormality. No acute or significant osseous findings. Review of the MIP images confirms the above findings. IMPRESSION: 1. Small right upper lobe posterior segmental artery pulmonary embolus. No evidence of right heart strain. 2. Aortic and coronary artery atherosclerosis. 3. Small pericardial effusion. 4. Large left and small right pleural effusions with associated atelectasis. These results will be called to the ordering clinician or representative by the Radiologist Assistant, and communication documented in the PACS or zVision Dashboard. Electronically Signed   By: Deatra RobinsonKevin  Herman M.D.   On: 02/18/2016 05:42        Scheduled Meds: . atorvastatin  10 mg Oral q1800  . carvedilol  6.25 mg Oral BID WC  . ceFEPime (MAXIPIME) IV  1 g Intravenous Q12H  . furosemide  20 mg  Intravenous Q12H  . nitroGLYCERIN  0.4 mg Transdermal Daily  . PARoxetine  20 mg Oral Daily  . vancomycin  500 mg Intravenous Q12H   Continuous Infusions: . heparin Stopped (02/19/16 1122)     LOS: 2 days     Jacquelin Hawking [facility] 02/19/2016, 2:07 PM Pager: [phone]  If 7PM-7AM, please contact night-coverage [email or web address] Password [email or web address] 02/19/2016, 2:07 PM

## 2016-02-19 NOTE — Procedures (Signed)
Ultrasound-guided diagnostic and therapeutic left thoracentesis performed yielding 510 cc of yellow fluid. No immediate complications. Follow-up chest x-ray pending. The fluid was sent to the lab for preordered studies. Only a small to mod left effusion was noted on today's limited US chest.

## 2016-02-19 NOTE — Progress Notes (Signed)
ANTICOAGULATION CONSULT NOTE   Pharmacy Consult for Heparin Indication: pulmonary embolus  No Known Allergies  Patient Measurements: Height: 5\' 2"  (157.5 cm) Weight: 138 lb 3.2 oz (62.7 kg) IBW/kg (Calculated) : 50.1 Heparin Dosing Weight:   Vital Signs: Temp: 98.2 F (36.8 C) (01/10 0517) Temp Source: Oral (01/10 0517) BP: 106/52 (01/10 0517) Pulse Rate: 80 (01/10 0517)  Labs:  Recent Labs  02/17/16 2256 02/18/16 0133 02/18/16 0425 02/18/16 0802 02/18/16 1552 02/19/16 0140 02/19/16 1114  HGB 10.0*  --  9.1*  --   --  8.5*  --   HCT 30.6*  --  27.6*  --   --  25.9*  --   PLT 294  --  283  --   --  282  --   APTT  --   --   --  35  --   --   --   LABPROT  --   --   --  15.5*  --   --   --   INR  --   --   --  1.22  --   --   --   HEPARINUNFRC  --   --   --   --  0.14* 0.31 0.23*  CREATININE 0.77  --  0.55  --   --  0.86  --   TROPONINI 0.03* <0.03  --  <0.03 <0.03  --   --     Estimated Creatinine Clearance: 39.3 mL/min (by C-G formula based on SCr of 0.86 mg/dL).   Medical History: Past Medical History:  Diagnosis Date  . Arthritis   . Elevated cholesterol   . Hearing deficit   . Hypertension   . Vaginal pessary present     Medications:  Infusions:  . heparin Stopped (02/19/16 1122)    Assessment: Patient with new PE on CT.  No oral anticoagulants noted on med rec.  Baseline coags ordered.  Enoxaparin given 1/8 PM, therefore no bolus.  02/19/16  Heparin level = 0.23 on heparin @ 1100 units/hr, level drawn before drip turned off  No bleeding complications noted  Goal of Therapy:  Heparin level 0.3-0.7 units/ml Monitor platelets by anticoagulation protocol: Yes   Plan:  -Heparin turned off  for pt to go to IR for thoracentesis, when appropriate per IR would resume heparin at 1250 units/hr -Check heparin level 8 hr after rate increase -Daily CBC  Arley Phenixllen Jasin Brazel RPh 02/19/2016, 12:20 PM Pager [phone]

## 2016-02-19 NOTE — Progress Notes (Signed)
ANTICOAGULATION CONSULT NOTE - Follow Up Consult  Pharmacy Consult for Heparin Indication: pulmonary embolus  No Known Allergies  Patient Measurements: Height: 5\' 2"  (157.5 cm) Weight: 136 lb 7.4 oz (61.9 kg) IBW/kg (Calculated) : 50.1 Heparin Dosing Weight:   Vital Signs: Temp: 98.6 F (37 C) (01/09 2051) Temp Source: Oral (01/09 2051) BP: 109/67 (01/09 2051) Pulse Rate: 80 (01/09 2051)  Labs:  Recent Labs  02/17/16 2256 02/18/16 0133 02/18/16 0425 02/18/16 0802 02/18/16 1552 02/19/16 0140  HGB 10.0*  --  9.1*  --   --  8.5*  HCT 30.6*  --  27.6*  --   --  25.9*  PLT 294  --  283  --   --  282  APTT  --   --   --  35  --   --   LABPROT  --   --   --  15.5*  --   --   INR  --   --   --  1.22  --   --   HEPARINUNFRC  --   --   --   --  0.14* 0.31  CREATININE 0.77  --  0.55  --   --  0.86  TROPONINI 0.03* <0.03  --  <0.03 <0.03  --     Estimated Creatinine Clearance: 39.1 mL/min (by C-G formula based on SCr of 0.86 mg/dL).   Medications:  Infusions:  . heparin 1,000 Units/hr (02/18/16 1800)    Assessment: Patient with heparin level at goal.  No heparin issues per RN.  Will increase heparin in order to keep level within goal range  Goal of Therapy:  Heparin level 0.3-0.7 units/ml Monitor platelets by anticoagulation protocol: Yes   Plan:  Increase heparin to 1100 units/hr Recheck level at [address]  Jai Bear Jr, Jacquenette ShoneJulian Crowford 02/19/2016,2:52 AM

## 2016-02-20 DIAGNOSIS — J181 Lobar pneumonia, unspecified organism: Secondary | ICD-10-CM

## 2016-02-20 LAB — HEPARIN LEVEL (UNFRACTIONATED): Heparin Unfractionated: 0.58 IU/mL (ref 0.30–0.70)

## 2016-02-20 LAB — BASIC METABOLIC PANEL
ANION GAP: 8 (ref 5–15)
BUN: 13 mg/dL (ref 6–20)
CO2: 27 mmol/L (ref 22–32)
CREATININE: 0.76 mg/dL (ref 0.44–1.00)
Calcium: 8.3 mg/dL — ABNORMAL LOW (ref 8.9–10.3)
Chloride: 100 mmol/L — ABNORMAL LOW (ref 101–111)
GFR calc Af Amer: >60 mL/min (ref >60)
GFR calc non Af Amer: >60 mL/min (ref >60)
Glucose, Bld: 111 mg/dL — ABNORMAL HIGH (ref 65–99)
POTASSIUM: 3.2 mmol/L — AB (ref 3.5–5.1)
SODIUM: 135 mmol/L (ref 135–145)

## 2016-02-20 LAB — CBC
HEMATOCRIT: 24.9 % — AB (ref 36.0–46.0)
Hemoglobin: 8.2 g/dL — ABNORMAL LOW (ref 12.0–15.0)
MCH: 29 pg (ref 26.0–34.0)
MCHC: 32.9 g/dL (ref 30.0–36.0)
MCV: 88 fL (ref 78.0–100.0)
Platelets: 289 10*3/uL (ref 150–400)
RBC: 2.83 MIL/uL — AB (ref 3.87–5.11)
RDW: 14.6 % (ref 11.5–15.5)
WBC: 9.7 10*3/uL (ref 4.0–10.5)

## 2016-02-20 LAB — PH, BODY FLUID: PH, BODY FLUID: 7.8

## 2016-02-20 LAB — LEGIONELLA PNEUMOPHILA SEROGP 1 UR AG: L. PNEUMOPHILA SEROGP 1 UR AG: NEGATIVE

## 2016-02-20 MED ORDER — DOXYCYCLINE HYCLATE 100 MG PO CAPS: 100.0000 mg | ORAL_CAPSULE | Freq: Two times a day (BID) | ORAL | 0 refills | Status: AC

## 2016-02-20 MED ORDER — AMOXICILLIN-POT CLAVULANATE 875-125 MG PO TABS: 1.0000 | ORAL_TABLET | Freq: Two times a day (BID) | ORAL | 0 refills | Status: AC

## 2016-02-20 MED ORDER — APIXABAN 5 MG PO TABS
10.0000 mg | ORAL_TABLET | Freq: Two times a day (BID) | ORAL | Status: DC
  Administered 2016-02-20: 10 mg via ORAL
  Filled 2016-02-20: qty 2

## 2016-02-20 MED ORDER — DOCUSATE SODIUM 100 MG PO CAPS: 100.0000 mg | ORAL_CAPSULE | Freq: Two times a day (BID) | ORAL | Status: DC | PRN

## 2016-02-20 MED ORDER — POLYETHYLENE GLYCOL 3350 17 G PO PACK: 17.0000 g | PACK | Freq: Every day | ORAL | 0 refills | Status: DC | PRN

## 2016-02-20 MED ORDER — POLYETHYLENE GLYCOL 3350 17 G PO PACK
17.0000 g | PACK | Freq: Two times a day (BID) | ORAL | Status: DC
  Administered 2016-02-20: 17 g via ORAL
  Filled 2016-02-20: qty 1

## 2016-02-20 MED ORDER — APIXABAN 5 MG PO TABS: ORAL_TABLET | ORAL | 0 refills | Status: DC

## 2016-02-20 MED ORDER — HEPARIN (PORCINE) IN NACL 100-0.45 UNIT/ML-% IJ SOLN
1400.0000 [IU]/h | INTRAMUSCULAR | Status: DC
  Filled 2016-02-20 (×2): qty 250

## 2016-02-20 MED ORDER — AMOXICILLIN-POT CLAVULANATE 875-125 MG PO TABS: 1.0000 | ORAL_TABLET | Freq: Two times a day (BID) | ORAL | 0 refills | Status: DC

## 2016-02-20 MED ORDER — POTASSIUM CHLORIDE CRYS ER 20 MEQ PO TBCR
40.0000 meq | EXTENDED_RELEASE_TABLET | Freq: Once | ORAL | Status: AC
  Administered 2016-02-20: 40 meq via ORAL
  Filled 2016-02-20: qty 2

## 2016-02-20 MED ORDER — APIXABAN 5 MG PO TABS: 5.0000 mg | ORAL_TABLET | Freq: Two times a day (BID) | ORAL | Status: DC

## 2016-02-20 MED ORDER — DOXYCYCLINE HYCLATE 100 MG PO CAPS: 100.0000 mg | ORAL_CAPSULE | Freq: Two times a day (BID) | ORAL | 0 refills | Status: DC

## 2016-02-20 NOTE — Progress Notes (Signed)
HHPT [facility] and [facility]  Sandford Crazeora Quentin Shorey RN,BSN,NCM [phone]

## 2016-02-20 NOTE — Evaluation (Addendum)
Physical Therapy Evaluation Patient Details Name: Kristen DoomCatherine A Proia MRN: [id number] DOB: 1925/05/15 Today's Date: 02/20/2016   History of Present Illness  83 y.o. female admitted with HCAP and pulmonary embolism (received lovenox 02/17/16 then heparin 02/18/16-02/20/16). PMH of CAD, CHF, HTN, MI November 2017 had cardiac cath  Clinical Impression  Pt admitted with above diagnosis. Pt currently with functional limitations due to the deficits listed below (see PT Problem List). Pt ambulated 200' without an assistive device, she had loss of balance x 1, SaO2 94% on RA walking. To reduce fall risk, pt was  encouraged to use her rollator at all times upon return to ILF. She'd benefit from HHPT to facilitate return to her prior level of being quite active.  Pt will benefit from skilled PT to increase their independence and safety with mobility to allow discharge to the venue listed below.       Follow Up Recommendations Home health PT    Equipment Recommendations  None recommended by PT    Recommendations for Other Services       Precautions / Restrictions Precautions Precautions: Fall Precaution Comments: denies falls in past 1 year Restrictions Weight Bearing Restrictions: No      Mobility  Bed Mobility Overal bed mobility: Independent                Transfers Overall transfer level: Modified independent               General transfer comment: uses armrests  Ambulation/Gait Ambulation/Gait assistance: Min assist;Min guard Ambulation Distance (Feet): 200 Feet Assistive device: None Gait Pattern/deviations: Wide base of support;Step-through pattern;Decreased stride length   Gait velocity interpretation: at or above normal speed for age/gender General Gait Details: loss of balance x 1 requiring min A, otherwise steady, SaO2 94% on RA, HR 90s, recommended pt use her rollator at all times until she's recovered from this hospitalization, she also stated she thinks she'd  walk better in her shoes rather than the hospital anti slip socks  Stairs            Wheelchair Mobility    Modified Rankin (Stroke Patients Only)       Balance Overall balance assessment: Needs assistance   Sitting balance-Leahy Scale: Good       Standing balance-Leahy Scale: Good Standing balance comment: LOB x 1 with walking                             Pertinent Vitals/Pain Pain Assessment: 0-10 Pain Score: 2  Pain Location: chest with deep breaths Pain Descriptors / Indicators: Sore Pain Intervention(s): Limited activity within patient's tolerance;Monitored during session    Home Living Family/patient expects to be discharged to:: Private residence Living Arrangements: Alone   Type of Home: Independent living facility Home Access: Level entry     Home Layout: One level Home Equipment: Environmental consultantWalker - 2 wheels;Walker - 4 wheels;Shower seat - built in Additional Comments[facility], doesn't drive    Prior Function Level of Independence: Independent with assistive device(s)         Comments: uses rollator to walk to dining room, walks with no AD in her apartment,  independent ADLs, goes to exercise classes regularly     Hand Dominance        Extremity/Trunk Assessment   Upper Extremity Assessment Upper Extremity Assessment: Overall WFL for tasks assessed    Lower Extremity Assessment Lower Extremity Assessment: Overall  WFL for tasks assessed    Cervical / Trunk Assessment Cervical / Trunk Assessment: Normal  Communication   Communication: No difficulties  Cognition Arousal/Alertness: Awake/alert Behavior During Therapy: WFL for tasks assessed/performed Overall Cognitive Status: Within Functional Limits for tasks assessed                      General Comments      Exercises     Assessment/Plan    PT Assessment Patient needs continued PT services  PT Problem List Decreased activity  tolerance;Decreased balance          PT Treatment Interventions Gait training;Therapeutic activities;Therapeutic exercise;Patient/family education    PT Goals (Current goals can be found in the Care Plan section)  Acute Rehab PT Goals Patient Stated Goal: return to ILF, go to exercise classes PT Goal Formulation: With patient Time For Goal Achievement: 03/05/16 Potential to Achieve Goals: Good    Frequency Min 3X/week   Barriers to discharge        Co-evaluation               End of Session Equipment Utilized During Treatment: Gait belt Activity Tolerance: Patient tolerated treatment well Patient left: in chair;with call bell/phone within reach;with chair alarm set Nurse Communication: Mobility status         Time: 4098-1191 PT Time Calculation (min) (ACUTE ONLY): 25 min   Charges:   PT Evaluation $PT Eval Low Complexity: 1 Procedure     PT G Codes:        Tamala Ser 02/20/2016, 10:20 AM [phone]

## 2016-02-20 NOTE — Discharge Instructions (Addendum)
Kristen Lewis, your found to have a pneumonia in addition to some fluid around her lungs and a pulmonary embolism. You're being treated for pneumonia with antibiotics. Please continue this as an outpatient until you finish your prescription. He will need a repeat chest x-ray in 3-4 weeks. The fluid around the lungs was aspirated and there are some lab that are still pending. Please follow-up with your primary care physician for results and further management. With regard to your pulmonary embolism, he was started on blood thinners. You'll be on these for 3 months. His are important for you to follow-up with their primary care physician. He has been a pleasure taking care of you,  Sincerely Jacquelin Hawkingalph Jerod Mcquain, M.D.  Information on my medicine - ELIQUIS (apixaban)  This medication education was reviewed with me or my healthcare representative as part of my discharge preparation.  The pharmacist that spoke with me during my hospital stay was: [place] Hunllen J.  Why was Eliquis prescribed for you? Eliquis was prescribed to treat blood clots that may have been found in the veins of your legs (deep vein thrombosis) or in your lungs (pulmonary embolism) and to reduce the risk of them occurring again.  What do You need to know about Eliquis ? The starting dose is 10 mg (two 5 mg tablets) taken TWICE daily for the FIRST SEVEN (7) DAYS, then on 02/27/16 the dose is reduced to ONE 5 mg tablet taken TWICE daily.  Eliquis may be taken with or without food.   Try to take the dose about the same time in the morning and in the evening. If you have difficulty swallowing the tablet whole please discuss with your pharmacist how to take the medication safely.  Take Eliquis exactly as prescribed and DO NOT stop taking Eliquis without talking to the doctor who prescribed the medication.  Stopping may increase your risk of developing a new blood clot.  Refill your prescription before you run out.  After discharge, you  should have regular check-up appointments with your healthcare provider that is prescribing your Eliquis.    What do you do if you miss a dose? If a dose of ELIQUIS is not taken at the scheduled time, take it as soon as possible on the same day and twice-daily administration should be resumed. The dose should not be doubled to make up for a missed dose.  Important Safety Information A possible side effect of Eliquis is bleeding. You should call your healthcare provider right away if you experience any of the following: ? Bleeding from an injury or your nose that does not stop. ? Unusual colored urine (red or dark brown) or unusual colored stools (red or black). ? Unusual bruising for unknown reasons. ? A serious fall or if you hit your head (even if there is no bleeding).  Some medicines may interact with Eliquis and might increase your risk of bleeding or clotting while on Eliquis. To help avoid this, consult your healthcare provider or pharmacist prior to using any new prescription or non-prescription medications, including herbals, vitamins, non-steroidal anti-inflammatory drugs (NSAIDs) and supplements.  This website has more information on Eliquis (apixaban): http://[email or web address].com/eliquis/home

## 2016-02-20 NOTE — Discharge Summary (Signed)
Physician Discharge Summary  Kristen Lewis ZOX:[id number] DOB: April 12, 1925 DOA: 02/17/2016  PCP: Gaspar Garbe, MD  Admit date: 02/17/2016 Discharge date: 02/20/2016  Admitted From: Home Disposition:  Home  Recommendations for Outpatient Follow-up:  1. Follow up with PCP in 1-2 weeks 2. Please obtain BMP/CBC in one week 3. Please follow up on the following pending results: Pleural fluid cytology, Urine Legionella 4. Repeat chest x-ray in 3-4 weeks 5. Anticoagulation for 3 months 6. Cardiology follow-up for heart failure  Discharge Condition: Stable CODE STATUS: Full code   Brief/Interim Summary:  HPI written by Dr. Toniann Fail on 02/17/2016  Chief Complaint: Chest pain. Cough.  HPI: Kristen Lewis is a 83 y.o. female with history of hypertension CHF has been experiencing chest pain on deep inspiration last 2 days. Patient also has been having some non-productive cough with subjective feeling of fever and chills. Patient went to the urgent care center. At the urgent care center patient was recorded to have a temperature of 100.23F. Labs done showed WBC of 15,000 with chest x-ray showing left lower lobe infiltrates and effusion. Since patient has persistent pleuritic chest pain patient was admitted directly for further management of pneumonia.  Patient states in November 2017 2 months ago patient had been admitted to the hospital in [place] for CHF and at that time patient also had undergone cardiac cath but did not have any stents placed. Since then patient has been on Lasix Coreg aspirin and statins.  Patient does get short of breath on exertion sometimes which improves with Lasix. Patient is presently not short of breath.   Hospital course:  Healthcare associated pneumonia Patient requiring oxygen at admission. This was eventually weaned down. She was treated empirically with vancomycin and cefepime. Blood cultures negative. Urine strep negative. Neurologic to Augmentin  and doxycycline.   Acute respiratory failure with hypoxia Possibly secondary to HCAP vs pleural effusion vs acute PE. Wean to room air. Vastly improved after thoracentesis.   Left pleural effusion Likely contributing to restart failure. Thoracentesis performed. Cytology pending. Not overtly suggesting anything specific. Lites criteria could not be obtained secondary to no serum LDH.  Acute pulmonary embolism Right upper lobe. Appears unprovoked. She was started on heparin and transition to Eliquis.  Chronic combined systolic and diastolic heart failure Appears compensated on exam. Echocardiogram confirmed systolic and diastolic dysfunction. Also, associated akinesis. All this correlates with recent heart attack. Continued on Lasix and Coreg. Recommend patient see cardiology as an outpatient.  CAD Patient states she had a heart attack in November with a negative cath. Troponin elevated very slightly and decreased. No EEG changes suggesting ACS. Chest pain sounds pleuritic and only present with deep inspiration. Continued Coreg and atorvastatin  Hypokalemia Repleted before discharge  Hyperlipidemia Continued atorvastatin  Hyponatremia Resolved with IV hydration and nutrition.  Normocytic anemia Stable. Iron low with normal person. Recommend outpatient iron replacement  Discharge Diagnoses:  Principal Problem:   HCAP (healthcare-associated pneumonia) Active Problems:   CAD (coronary artery disease)    CHF (congestive heart failure) (HCC)   Essential hypertension   Hyperlipidemia   Pneumonia   Pulmonary embolus (HCC)   Pleural effusion   Hypokalemia   Hyponatremia    Discharge Instructions   Allergies as of 02/20/2016   No Known Allergies     Medication List    STOP taking these medications   ibuprofen 600 MG tablet Commonly known as:  ADVIL,MOTRIN     TAKE these medications   amoxicillin-clavulanate 875-125 MG  tablet Commonly known as:  AUGMENTIN Take  1 tablet by mouth 2 (two) times daily.   apixaban 5 MG Tabs tablet Commonly known as:  ELIQUIS Take 10mg  (two tablets) twice daily for 7 days, then take 5mg  (one tablet) twice daily.   aspirin 81 MG tablet Take 81 mg by mouth daily.   atorvastatin 20 MG tablet Commonly known as:  LIPITOR Take 20 mg by mouth daily.   CALCIUM 500 PO Take 1 tablet by mouth daily.   carvedilol 3.125 MG tablet Commonly known as:  COREG Take 3.125 mg by mouth 2 (two) times daily.   cholecalciferol 1000 units tablet Commonly known as:  VITAMIN D Take 1,000 Units by mouth every morning.   [employer] SPECTRAVITE ADULT 50+ Tabs Take 1 tablet by mouth daily.   doxycycline 100 MG capsule Commonly known as:  VIBRAMYCIN Take 1 capsule (100 mg total) by mouth 2 (two) times daily.   estradiol 0.1 MG/GM vaginal cream Commonly known as:  ESTRACE Use 1/4 applicator twice weekly   furosemide 40 MG tablet Commonly known as:  LASIX Take 40 mg by mouth daily.   lisinopril 40 MG tablet Commonly known as:  PRINIVIL,ZESTRIL Take 40 mg by mouth daily.   nitroGLYCERIN 0.2 mg/hr patch Commonly known as:  NITRODUR - Dosed in mg/24 hr Place 1 patch onto the skin daily.   PARoxetine 20 MG tablet Commonly known as:  PAXIL Take 20 mg by mouth daily.   polyethylene glycol packet Commonly known as:  MIRALAX / GLYCOLAX Take 17 g by mouth daily as needed.   Potassium Chloride ER 20 MEQ Tbcr Take 20 mEq by mouth daily.   ticagrelor 90 MG Tabs tablet Commonly known as:  BRILINTA Take 90 mg by mouth 2 (two) times daily.      Follow-up Information    Gaspar Garbe, MD. Schedule an appointment as soon as possible for a visit in 1 week(s).   Specialty:  Internal Medicine Why:  Hospital follow-up Contact information: [address] [place] [address] [phone]        [facility]. Schedule an appointment as soon as possible for a visit in 2 week(s).    Why:  Heart failure Contact information: [address] [place] [address] [phone]         No Known Allergies  Consultations:  None   Procedures/Studies: Dg Chest 1 View  Result Date: 02/19/2016 CLINICAL DATA:  Post left thoracentesis EXAM: CHEST 1 VIEW COMPARISON:  02/17/2016 FINDINGS: Decreased left pleural effusion following thoracentesis. No pneumothorax. Left lower lobe atelectasis has improved Mild right lower lobe airspace disease shows progression. Small right effusion. IMPRESSION: No pneumothorax post left thoracentesis. Improved aeration in the left lung base Progression of right lower lobe airspace disease. Small right effusion unchanged. Electronically Signed   By: Marlan Palau M.D.   On: 02/19/2016 15:22   Dg Chest 2 View  Result Date: 02/17/2016 CLINICAL DATA:  Chest pain when breathing. Shortness of breath. Pneumonia for 2 days. History of myocardial infarct, bronchitis, former smoker, hypertension. EXAM: CHEST  2 VIEW COMPARISON:  None. FINDINGS: Moderate size left pleural effusion with atelectasis or consolidation in the left lung base. This may indicate pneumonia. Slight fibrosis or linear atelectasis in the right lung base. Central interstitial pattern with peribronchial thickening suggesting chronic bronchitis. No pneumothorax. Normal heart size and pulmonary vascularity. Calcified and tortuous aorta. Degenerative changes in the spine. Surgical clips in the upper abdomen.  IMPRESSION: Consolidation or atelectasis in the left lung base with moderate left pleural effusion. Changes may indicate pneumonia. Followup PA and lateral chest X-ray is recommended in 3-4 weeks following appropriate clinical therapy to ensure resolution and exclude underlying malignancy. Electronically Signed   By: Burman Nieves M.D.   On: 02/17/2016 22:27   Ct Angio Chest Pe W Or Wo Contrast  Result Date: 02/18/2016 CLINICAL DATA:  Shortness of breath EXAM: CT  ANGIOGRAPHY CHEST WITH CONTRAST TECHNIQUE: Multidetector CT imaging of the chest was performed using the standard protocol during bolus administration of intravenous contrast. Multiplanar CT image reconstructions and MIPs were obtained to evaluate the vascular anatomy. CONTRAST:  100 mL Isovue 370 IV COMPARISON:  Chest radiograph 02/17/2016 FINDINGS: Cardiovascular: Contrast injection is sufficient to demonstrate satisfactory opacification of the pulmonary arteries to the segmental level. There is a small filling defect within the right upper lobe posterior segmental pulmonary artery (series 5 image 85, series 9 image 62). No other pulmonary emboli are identified. The main pulmonary artery is within normal limits for size. There is no CT evidence of acute right heart strain. There is atherosclerotic calcification of the aorta. Coronary artery calcifications are also noted. There is a normal 3-vessel arch branching pattern. Heart size is normal. There is a small pericardial effusion, measuring up to 6 mm in thickness. Mediastinum/Nodes: No mediastinal, hilar or axillary lymphadenopathy. The visualized thyroid and thoracic esophageal course are unremarkable. Lungs/Pleura: There are large left and small right pleural effusions. There is moderate collapse of the left lower lobe and platelike atelectasis within the lingula. Upper Abdomen: Contrast bolus timing is not optimized for evaluation of the abdominal organs. Within this limitation, the visualized organs of the upper abdomen are normal. Musculoskeletal: No chest wall abnormality. No acute or significant osseous findings. Review of the MIP images confirms the above findings. IMPRESSION: 1. Small right upper lobe posterior segmental artery pulmonary embolus. No evidence of right heart strain. 2. Aortic and coronary artery atherosclerosis. 3. Small pericardial effusion. 4. Large left and small right pleural effusions with associated atelectasis. These results will be  called to the ordering clinician or representative by the Radiologist Assistant, and communication documented in the PACS or zVision Dashboard. Electronically Signed   By: Deatra Robinson M.D.   On: 02/18/2016 05:42   US Thoracentesis Asp Pleural Space W/img Guide  Result Date: 02/19/2016 INDICATION: Patient with history of pneumonia, PE, CHF, coronary artery disease, left pleural effusion. Request made for diagnostic and therapeutic left thoracentesis. EXAM: ULTRASOUND GUIDED DIAGNOSTIC AND THERAPEUTIC LEFT THORACENTESIS MEDICATIONS: None. COMPLICATIONS: None immediate. PROCEDURE: An ultrasound guided thoracentesis was thoroughly discussed with the patient and questions answered. The benefits, risks, alternatives and complications were also discussed. The patient understands and wishes to proceed with the procedure. Written consent was obtained. Ultrasound was performed to localize and mark an adequate pocket of fluid in the left chest. The area was then prepped and draped in the normal sterile fashion. 1% Lidocaine was used for local anesthesia. Under ultrasound guidance a Safe-T-Centesis catheter was introduced. Thoracentesis was performed. The catheter was removed and a dressing applied. FINDINGS: A total of approximately 510 cc of yellow fluid was removed. Samples were sent to the laboratory as requested by the clinical team. IMPRESSION: Successful ultrasound guided diagnostic and therapeutic left thoracentesis yielding 510 cc of pleural fluid. Read by: Jeananne Rama, PA-C Electronically Signed   By: Gilmer Mor D.O.   On: 02/19/2016 16:37     Echocardiogram (02/19/2016) Study Conclusions  -  Left ventricle: The cavity size was normal. Systolic function was   mildly to moderately reduced. The estimated ejection fraction was   in the range of 40% to 45%. There is akinesis of the   inferolateral, inferior, and inferoseptal myocardium. Doppler   parameters are consistent with abnormal left  ventricular   relaxation (grade 1 diastolic dysfunction). - Mitral valve: Calcified annulus. There was moderate   regurgitation. - Pulmonary arteries: Systolic pressure was mildly increased. PA   peak pressure: 36 mm Hg (S).  Subjective: Patient reports no dyspnea. Feeling much better. No chest pain.  Discharge Exam: Vitals:   02/19/16 2141 02/20/16 0422  BP: 111/74 112/61  Pulse: 84 94  Resp: 16 16  Temp: 99.1 F (37.3 C) 98.2 F (36.8 C)   Vitals:   02/19/16 1516 02/19/16 2141 02/20/16 0422 02/20/16 0946  BP: 101/67 111/74 112/61   Pulse: 90 84 94   Resp: 16 16 16    Temp: 98.6 F (37 C) 99.1 F (37.3 C) 98.2 F (36.8 C)   TempSrc: Oral Oral Oral   SpO2: 98% 98% 94% 96%  Weight:      Height:        General exam: Appears calm and comfortable Respiratory system: Clear breath sounds bilaterally. Respiratory effort normal. Cardiovascular system: S1 & S2 heard, RRR. No murmurs, rubs, gallops or clicks. Gastrointestinal system: Abdomen is nondistended, soft and nontender. No organomegaly or masses felt. Normal bowel sounds heard. Central nervous system: Alert and oriented. No focal neurological deficits. Extremities: No edema. No calf tenderness Skin: No cyanosis. No rashes Psychiatry: Judgement and insight appear normal. Mood & affect appropriate.   The results of significant diagnostics from this hospitalization (including imaging, microbiology, ancillary and laboratory) are listed below for reference.     Microbiology: Recent Results (from the past 240 hour(s))  Culture, blood (routine x 2)     Status: None (Preliminary result)   Collection Time: 02/17/16 10:56 PM  Result Value Ref Range Status   Specimen Description BLOOD RIGHT ANTECUBITAL  Final   Special Requests BOTTLES DRAWN AEROBIC AND ANAEROBIC  Final   Culture   Final    NO GROWTH 1 DAY Performed at [facility]    Report Status PENDING  Incomplete  Culture, blood (routine x 2)     Status:  None (Preliminary result)   Collection Time: 02/17/16 10:56 PM  Result Value Ref Range Status   Specimen Description BLOOD LEFT ANTECUBITAL  Final   Special Requests BOTTLES DRAWN AEROBIC AND ANAEROBIC 5.5ML  Final   Culture   Final    NO GROWTH 1 DAY Performed at [facility]    Report Status PENDING  Incomplete  Gram stain     Status: None   Collection Time: 02/19/16  2:42 PM  Result Value Ref Range Status   Specimen Description FLUID PLEURAL  Final   Special Requests NONE  Final   Gram Stain   Final    WBC PRESENT,BOTH PMN AND MONONUCLEAR NO ORGANISMS SEEN CYTOSPIN Performed at [facility]    Report Status 02/19/2016 FINAL  Final     Labs: BNP (last 3 results)  Recent Labs  02/17/16 2256  BNP 949.0*   Basic Metabolic Panel:  Recent Labs Lab 02/17/16 2256 02/18/16 0425 02/19/16 0140 02/20/16 0416  NA 134* 132* 134* 135  K 3.7 3.4* 3.9 3.2*  CL 99* 101 101 100*  CO2 24 23 25 27   GLUCOSE 170* 148* 123* 111*  BUN 14 11 13 13   CREATININE 0.77 0.55 0.86 0.76  CALCIUM 8.8* 8.2* 8.4* 8.3*  MG  --   --  1.7  --   PHOS  --   --  3.2  --    Liver Function Tests:  Recent Labs Lab 02/17/16 2256 02/19/16 0140  AST 16 16  ALT 16 15  ALKPHOS 74 65  BILITOT 0.8 0.7  PROT 7.5 6.1*  ALBUMIN 3.3* 2.6*   No results for input(s): LIPASE, AMYLASE in the last 168 hours. No results for input(s): AMMONIA in the last 168 hours. CBC:  Recent Labs Lab 02/17/16 2256 02/18/16 0425 02/19/16 0140 02/20/16 0416  WBC 14.1* 12.1* 12.2* 9.7  NEUTROABS 11.4*  --  9.0*  --   HGB 10.0* 9.1* 8.5* 8.2*  HCT 30.6* 27.6* 25.9* 24.9*  MCV 89.5 88.2 88.4 88.0  PLT 294 283 282 289   Cardiac Enzymes:  Recent Labs Lab 02/17/16 2256 02/18/16 0133 02/18/16 0802 02/18/16 1552  TROPONINI 0.03* <0.03 <0.03 <0.03   BNP: Invalid input(s): POCBNP CBG: No results for input(s): GLUCAP in the last 168 hours. D-Dimer  Recent Labs  02/17/16 2256  DDIMER 1.83*    Hgb A1c No results for input(s): HGBA1C in the last 72 hours. Lipid Profile No results for input(s): CHOL, HDL, LDLCALC, TRIG, CHOLHDL, LDLDIRECT in the last 72 hours. Thyroid function studies No results for input(s): TSH, T4TOTAL, T3FREE, THYROIDAB in the last 72 hours.  Invalid input(s): FREET3 Anemia work up  Recent Labs  02/19/16 1517  VITAMINB12 546  FOLATE 19.8  FERRITIN 221  TIBC 193*  IRON 6*  RETICCTPCT 1.8   Urinalysis    Component Value Date/Time   COLORURINE YELLOW 11/01/2013 1633   APPEARANCEUR CLOUDY (A) 11/01/2013 1633   LABSPEC >1.030 (H) 11/01/2013 1633   PHURINE 6.0 11/01/2013 1633   GLUCOSEU NEG 11/01/2013 1633   HGBUR MOD (A) 11/01/2013 1633   BILIRUBINUR NEG 11/01/2013 1633   KETONESUR NEG 11/01/2013 1633   PROTEINUR 30 (A) 11/01/2013 1633   UROBILINOGEN 0.2 11/01/2013 1633   NITRITE NEG 11/01/2013 1633   LEUKOCYTESUR LARGE (A) 11/01/2013 1633   Sepsis Labs Invalid input(s): PROCALCITONIN,  WBC,  LACTICIDVEN Microbiology Recent Results (from the past 240 hour(s))  Culture, blood (routine x 2)     Status: None (Preliminary result)   Collection Time: 02/17/16 10:56 PM  Result Value Ref Range Status   Specimen Description BLOOD RIGHT ANTECUBITAL  Final   Special Requests BOTTLES DRAWN AEROBIC AND ANAEROBIC 9ML  Final   Culture   Final    NO GROWTH 1 DAY [facility]    Report Status PENDING  Incomplete  Culture, blood (routine x 2)     Status: None (Preliminary result)   Collection Time: 02/17/16 10:56 PM  Result Value Ref Range Status   Specimen Description BLOOD LEFT ANTECUBITAL  Final   Special Requests BOTTLES DRAWN AEROBIC AND ANAEROBIC 5.5ML  Final   Culture   Final    NO GROWTH 1 DAY Performed at [facility]    Report Status PENDING  Incomplete  Gram stain     Status: None   Collection Time: 02/19/16  2:42 PM  Result Value Ref Range Status   Specimen Description FLUID PLEURAL  Final   Special  Requests NONE  Final   Gram Stain   Final    WBC PRESENT,BOTH PMN AND MONONUCLEAR NO ORGANISMS SEEN CYTOSPIN Performed at [facility]  Report Status 02/19/2016 FINAL  Final     Time coordinating discharge: Over 30 minutes  SIGNED:   Jacquelin Hawking, MD [facility] 02/20/2016, 3:10 PM Pager [phone]  If 7PM-7AM, please contact night-coverage [email or web address] Password [email or web address]

## 2016-02-20 NOTE — Progress Notes (Signed)
ANTICOAGULATION CONSULT NOTE   Pharmacy Consult for Heparin Indication: pulmonary embolus  No Known Allergies  Patient Measurements: Height: 5\' 2"  (157.5 cm) Weight: 138 lb 3.2 oz (62.7 kg) IBW/kg (Calculated) : 50.1 Heparin Dosing Weight:   Vital Signs: Temp: 99.1 F (37.3 C) (01/10 2141) Temp Source: Oral (01/10 2141) BP: 111/74 (01/10 2141) Pulse Rate: 84 (01/10 2141)  Labs:  Recent Labs  02/17/16 2256 02/18/16 0133 02/18/16 0425 02/18/16 0802  02/18/16 1552 02/19/16 0140 02/19/16 1114 02/19/16 2320  HGB 10.0*  --  9.1*  --   --   --  8.5*  --   --   HCT 30.6*  --  27.6*  --   --   --  25.9*  --   --   PLT 294  --  283  --   --   --  282  --   --   APTT  --   --   --  35  --   --   --   --   --   LABPROT  --   --   --  15.5*  --   --   --   --   --   INR  --   --   --  1.22  --   --   --   --   --   HEPARINUNFRC  --   --   --   --   < > 0.14* 0.31 0.23* <0.10*  CREATININE 0.77  --  0.55  --   --   --  0.86  --   --   TROPONINI 0.03* <0.03  --  <0.03  --  <0.03  --   --   --   < > = values in this interval not displayed.  Estimated Creatinine Clearance: 39.3 mL/min (by C-G formula based on SCr of 0.86 mg/dL).   Medical History: Past Medical History:  Diagnosis Date  . Arthritis   . Elevated cholesterol   . Hearing deficit   . Hypertension   . Vaginal pessary present     Medications:  Infusions:  . heparin      Assessment: Patient with new PE on CT.  No oral anticoagulants noted on med rec.  Baseline coags ordered.  Enoxaparin given 1/8 PM, therefore no bolus.   1/10  Heparin level = 0.23 on heparin @ 1100 units/hr, level drawn before drip turned off  No bleeding complications noted  Now s/p thoracentesis, per IR, may resume heparin at 16:00 if stable (CXR = No pneumothorax post left thoracentesis. Improved aeration in the left lung base; Progression of right lower lobe airspace disease. Small right effusion unchanged.)  2310 HL=<0.10 , no  bleeding or infusion issues per RN   Goal of Therapy:  Heparin level 0.3-0.7 units/ml Monitor platelets by anticoagulation protocol: Yes   Plan:  -Increase heparin drip to 1400 units/hr -Check heparin level 8hr after increasing drip -Daily CBC, heparin level -Follow up plans for transition to oral anticoagulation   Lorenza EvangelistGreen, Lanetta Figuero R 02/20/2016, 12:05 AM

## 2016-02-20 NOTE — Care Management Note (Signed)
Case Management Note  Patient Details  Name: Kristen Lewis MRN: [id number] Date of Birth: June 09, 1925  Subjective/Objective:            83 yo admitted with HCAP        Action/Plan: Pt from [facility]. PT recommendation was for HHPT. This CM met with pt at bedside to offer choice for HHPT. Pt states that [facility] has a Tour manager that they use and she would like to use them. This CM contacted [facility] and will fax MD orders when written. [facility] phone :[phone]  Fax:[phone]. CM will continue to follow.  Expected Discharge Date:                  Expected Discharge Plan:  [place]  In-House Referral:     Discharge planning Services  CM Consult  Post Acute Care Choice:  Home Health Choice offered to:  Patient  DME Arranged:    DME Agency:   [employer] school teacher)  HH Arranged:  PT HH Agency:   Secondary school teacher)  Status of Service:  In process, will continue to follow  If discussed at Long Length of Stay Meetings, dates discussed:    Additional CommentsLynnell Catalan, RN 02/20/2016, 2:56 PM  [phone]

## 2016-02-20 NOTE — Progress Notes (Signed)
Patient discharged to home with friend, discharge instructions reviewed with patient who verbalized understanding. New RX's sent to pharmacy.

## 2016-02-20 NOTE — Progress Notes (Addendum)
ANTICOAGULATION CONSULT NOTE   Pharmacy Consult for Heparin Indication: pulmonary embolus  No Known Allergies  Patient Measurements: Height: 5\' 2"  (157.5 cm) Weight: 138 lb 3.2 oz (62.7 kg) IBW/kg (Calculated) : 50.1 Heparin Dosing Weight:   Vital Signs: Temp: 98.2 F (36.8 C) (01/11 0422) Temp Source: Oral (01/11 0422) BP: 112/61 (01/11 0422) Pulse Rate: 94 (01/11 0422)  Labs:  Recent Labs  02/18/16 0133 02/18/16 0425 02/18/16 0802  02/18/16 1552 02/19/16 0140 02/19/16 1114 02/19/16 2320 02/20/16 0416 02/20/16 0849  HGB  --  9.1*  --   --   --  8.5*  --   --  8.2*  --   HCT  --  27.6*  --   --   --  25.9*  --   --  24.9*  --   PLT  --  283  --   --   --  282  --   --  289  --   APTT  --   --  35  --   --   --   --   --   --   --   LABPROT  --   --  15.5*  --   --   --   --   --   --   --   INR  --   --  1.22  --   --   --   --   --   --   --   HEPARINUNFRC  --   --   --   < > 0.14* 0.31 0.23* <0.10*  --  0.58  CREATININE  --  0.55  --   --   --  0.86  --   --  0.76  --   TROPONINI <0.03  --  <0.03  --  <0.03  --   --   --   --   --   < > = values in this interval not displayed.  Estimated Creatinine Clearance: 42.3 mL/min (by C-G formula based on SCr of 0.76 mg/dL).   Medical History: Past Medical History:  Diagnosis Date  . Arthritis   . Elevated cholesterol   . Hearing deficit   . Hypertension   . Vaginal pessary present     Medications:  Infusions:  . heparin 1,400 Units/hr (02/20/16 0015)    Assessment: Patient with new PE on CT.  No oral anticoagulants noted on med rec.  Baseline coags ordered.  Enoxaparin given 1/8 PM, therefore no bolus.  Significant Events: 1/10 thoracentesis: No pneumothorax post left thoracentesis. Improved aeration in the left lung base; Progression of right lower lobe airspace disease. Small right effusion unchanged.  02/20/2016 HL=0.58 therapeutic H/H low but stable No issues or bleeding per RN Hopefully switch to  oral agent soon  Goal of Therapy:  Heparin level 0.3-0.7 units/ml Monitor platelets by anticoagulation protocol: Yes   Plan:  -continue heparin drip at 1400 units/hr -Check heparin level 8hr  -Daily CBC, heparin level -Follow up plans for transition to oral anticoagulation   Arley Phenixllen Lamine Laton RPh 02/20/2016, 10:26 AM Pager [phone]  Heparin stopped and Pharmacy consulted to dose apixaban: Scr 0.76, CrCl ~ 7542mls/min Wt 62.7kg Plan: -apixaban 10mg  po twice daily for 7 days then take 5mg  twice daily thereafter - follow renal function and CBC while inpatient - pharmacy to provide education  Arley Phenixllen Amarri Michaelson RPh 02/20/2016, 1:32 PM Pager [phone]

## 2016-02-23 LAB — CULTURE, BLOOD (ROUTINE X 2)
CULTURE: NO GROWTH
Culture: NO GROWTH

## 2016-02-24 LAB — CULTURE, BODY FLUID-BOTTLE: CULTURE: NO GROWTH

## 2016-03-10 NOTE — Progress Notes (Deleted)
HPI: 83 year old female for evaluation of congestive heart failure. Based on history patient was admitted to a hospital in [place] in November 2017 with congestive heart failure. By report she had a cardiac catheterization but no intervention. She was treated with Lasix. Admitted January 2018 with pleuritic CP and diagnosed with pulmonary embolus. Chest CT also showed small pericardial effusion, large left and small right pleural effusion. Echocardiogram January 2018 showed ejection fraction 40-45%, grade 1 diastolic dysfunction, moderate mitral regurgitation and mildly elevated pulmonary pressure. Patient was treated with antibiotics for possible pneumonia and also had thoracentesis (cytology with atypical cells favored to be reactive). Also diuresed (BNP 949),   Current Outpatient Prescriptions  Medication Sig Dispense Refill  . apixaban (ELIQUIS) 5 MG TABS tablet Take 10mg  (two tablets) twice daily for 7 days, then take 5mg  (one tablet) twice daily. 74 tablet 0  . aspirin 81 MG tablet Take 81 mg by mouth daily.    Marland Kitchen. atorvastatin (LIPITOR) 20 MG tablet Take 20 mg by mouth daily.  0  . Calcium-Magnesium-Vitamin D (CALCIUM 500 PO) Take 1 tablet by mouth daily.    . carvedilol (COREG) 3.125 MG tablet Take 3.125 mg by mouth 2 (two) times daily.  3  . cholecalciferol (VITAMIN D) 1000 UNITS tablet Take 1,000 Units by mouth every morning.     Marland Kitchen. estradiol (ESTRACE) 0.1 MG/GM vaginal cream Use 1/4 applicator twice weekly 42.5 g 12  . furosemide (LASIX) 40 MG tablet Take 40 mg by mouth daily.  0  . lisinopril (PRINIVIL,ZESTRIL) 40 MG tablet Take 40 mg by mouth daily.    . Multiple Vitamins-Minerals ([employer]S SPECTRAVITE ADULT 50+) TABS Take 1 tablet by mouth daily.    . nitroGLYCERIN (NITRODUR - DOSED IN MG/24 HR) 0.2 mg/hr patch Place 1 patch onto the skin daily.  0  . PARoxetine (PAXIL) 20 MG tablet Take 20 mg by mouth daily.    . polyethylene glycol (MIRALAX / GLYCOLAX) packet Take 17 g by mouth  daily as needed. 14 each 0  . Potassium Chloride ER 20 MEQ TBCR Take 20 mEq by mouth daily.  0  . ticagrelor (BRILINTA) 90 MG TABS tablet Take 90 mg by mouth 2 (two) times daily.     No current facility-administered medications for this visit.     No Known Allergies  Past Medical History:  Diagnosis Date  . Arthritis   . Elevated cholesterol   . Hearing deficit   . Hypertension   . Vaginal pessary present     Past Surgical History:  Procedure Laterality Date  . CARDIAC CATHETERIZATION    . CHOLECYSTECTOMY    . VAGINAL HYSTERECTOMY     Leiomyomata/bleeding    Social History   Social History  . Marital status: Married    Spouse name: N/A  . Number of children: N/A  . Years of education: N/A   Occupational History  . Not on file.   Social History Main Topics  . Smoking status: Never Smoker  . Smokeless tobacco: Never Used  . Alcohol use 0.0 oz/week     Comment: Rare  . Drug use: No  . Sexual activity: No     Comment: 1st intercourse 83 yo-1 partner   Other Topics Concern  . Not on file   Social History Narrative  . No narrative on file    Family History  Problem Relation Age of Onset  . Cancer Mother     Intestinal/Liver  . Hypertension Mother   .  Hypertension Father   . Heart attack Father   . Hypertension Brother   . Heart disease Brother     ROS: no fevers or chills, productive cough, hemoptysis, dysphasia, odynophagia, melena, hematochezia, dysuria, hematuria, rash, seizure activity, orthopnea, PND, pedal edema, claudication. Remaining systems are negative.  Physical Exam:   There were no vitals taken for this visit.  General:  Well developed/well nourished in NAD Skin warm/dry Patient not depressed No peripheral clubbing Back-normal HEENT-normal/normal eyelids Neck supple/normal carotid upstroke bilaterally; no bruits; no JVD; no thyromegaly chest - CTA/ normal expansion CV - RRR/normal S1 and S2; no murmurs, rubs or gallops;  PMI  nondisplaced Abdomen -NT/ND, no HSM, no mass, + bowel sounds, no bruit 2+ femoral pulses, no bruits Ext-no edema, chords, 2+ DP Neuro-grossly nonfocal  ECG

## 2016-03-11 ENCOUNTER — Encounter (HOSPITAL_COMMUNITY): Payer: Self-pay | Admitting: Emergency Medicine

## 2016-03-11 ENCOUNTER — Emergency Department (HOSPITAL_COMMUNITY): Payer: Medicare Other

## 2016-03-11 ENCOUNTER — Observation Stay (HOSPITAL_COMMUNITY)
Admission: EM | Admit: 2016-03-11 | Discharge: 2016-03-13 | Disposition: A | Discharge status: Home / Self Care | Payer: Medicare Other | Attending: Internal Medicine | Admitting: Internal Medicine

## 2016-03-11 ENCOUNTER — Telehealth: Payer: Self-pay | Admitting: Cardiology

## 2016-03-11 DIAGNOSIS — R0781 Pleurodynia: Secondary | ICD-10-CM | POA: Diagnosis present

## 2016-03-11 DIAGNOSIS — Z7982 Long term (current) use of aspirin: Secondary | ICD-10-CM | POA: Diagnosis not present

## 2016-03-11 DIAGNOSIS — Z79899 Other long term (current) drug therapy: Secondary | ICD-10-CM | POA: Diagnosis not present

## 2016-03-11 DIAGNOSIS — E785 Hyperlipidemia, unspecified: Secondary | ICD-10-CM | POA: Diagnosis not present

## 2016-03-11 DIAGNOSIS — I509 Heart failure, unspecified: Secondary | ICD-10-CM | POA: Diagnosis not present

## 2016-03-11 DIAGNOSIS — A419 Sepsis, unspecified organism: Secondary | ICD-10-CM | POA: Diagnosis not present

## 2016-03-11 DIAGNOSIS — Z7901 Long term (current) use of anticoagulants: Secondary | ICD-10-CM | POA: Insufficient documentation

## 2016-03-11 DIAGNOSIS — I110 Hypertensive heart disease with heart failure: Secondary | ICD-10-CM | POA: Diagnosis not present

## 2016-03-11 DIAGNOSIS — D72829 Elevated white blood cell count, unspecified: Secondary | ICD-10-CM | POA: Diagnosis not present

## 2016-03-11 DIAGNOSIS — I2510 Atherosclerotic heart disease of native coronary artery without angina pectoris: Secondary | ICD-10-CM | POA: Diagnosis not present

## 2016-03-11 DIAGNOSIS — Z955 Presence of coronary angioplasty implant and graft: Secondary | ICD-10-CM | POA: Diagnosis not present

## 2016-03-11 DIAGNOSIS — I10 Essential (primary) hypertension: Secondary | ICD-10-CM | POA: Diagnosis present

## 2016-03-11 DIAGNOSIS — R0602 Shortness of breath: Secondary | ICD-10-CM

## 2016-03-11 DIAGNOSIS — R079 Chest pain, unspecified: Secondary | ICD-10-CM | POA: Diagnosis present

## 2016-03-11 DIAGNOSIS — I2699 Other pulmonary embolism without acute cor pulmonale: Secondary | ICD-10-CM | POA: Diagnosis present

## 2016-03-11 DIAGNOSIS — I5020 Unspecified systolic (congestive) heart failure: Secondary | ICD-10-CM

## 2016-03-11 DIAGNOSIS — R6510 Systemic inflammatory response syndrome (SIRS) of non-infectious origin without acute organ dysfunction: Secondary | ICD-10-CM | POA: Diagnosis present

## 2016-03-11 DIAGNOSIS — E871 Hypo-osmolality and hyponatremia: Secondary | ICD-10-CM | POA: Diagnosis present

## 2016-03-11 LAB — BASIC METABOLIC PANEL
ANION GAP: 10 (ref 5–15)
BUN: 17 mg/dL (ref 6–20)
CHLORIDE: 98 mmol/L — AB (ref 101–111)
CO2: 24 mmol/L (ref 22–32)
CREATININE: 1.03 mg/dL — AB (ref 0.44–1.00)
Calcium: 9 mg/dL (ref 8.9–10.3)
GFR calc non Af Amer: 47 mL/min — ABNORMAL LOW (ref >60)
GFR, EST AFRICAN AMERICAN: 55 mL/min — AB (ref >60)
Glucose, Bld: 129 mg/dL — ABNORMAL HIGH (ref 65–99)
POTASSIUM: 3.8 mmol/L (ref 3.5–5.1)
Sodium: 132 mmol/L — ABNORMAL LOW (ref 135–145)

## 2016-03-11 LAB — I-STAT TROPONIN, ED
Troponin i, poc: 0 ng/mL (ref 0.00–0.08)
Troponin i, poc: 0.01 ng/mL (ref 0.00–0.08)
Troponin i, poc: 0 ng/mL (ref 0.00–0.08)

## 2016-03-11 LAB — CBC
HEMATOCRIT: 32.5 % — AB (ref 36.0–46.0)
HEMOGLOBIN: 10.5 g/dL — AB (ref 12.0–15.0)
MCH: 28.6 pg (ref 26.0–34.0)
MCHC: 32.3 g/dL (ref 30.0–36.0)
MCV: 88.6 fL (ref 78.0–100.0)
Platelets: 269 10*3/uL (ref 150–400)
RBC: 3.67 MIL/uL — AB (ref 3.87–5.11)
RDW: 15.3 % (ref 11.5–15.5)
WBC: 13.9 10*3/uL — ABNORMAL HIGH (ref 4.0–10.5)

## 2016-03-11 MED ORDER — ASPIRIN 81 MG PO CHEW
324.0000 mg | CHEWABLE_TABLET | Freq: Once | ORAL | Status: AC
  Administered 2016-03-11: 324 mg via ORAL
  Filled 2016-03-11: qty 4

## 2016-03-11 MED ORDER — SODIUM CHLORIDE 0.9 % IV BOLUS (SEPSIS)
500.0000 mL | Freq: Once | INTRAVENOUS | Status: AC
  Administered 2016-03-11: 500 mL via INTRAVENOUS

## 2016-03-11 NOTE — ED Notes (Signed)
ED Provider at bedside. 

## 2016-03-11 NOTE — Telephone Encounter (Signed)
[facility] on 03/16/16 with Dr Jens Somrenshaw.  Records put with Dr Ludwig Clarksrenshaw's schedule for 03/16/16.

## 2016-03-11 NOTE — H&P (Signed)
History and Physical    Kristen Lewis ZOX:[id number] DOB: 10-27-1925 DOA: 03/11/2016  Referring MD/NP/PA: Page PCP: Gaspar Garbe, MD  Patient coming from: [facility] independent living Chief Complaint:  Chest pain  HPI: Kristen Lewis is a 83 y.o. female with medical history significant of a HTN and systolic CHF last EF 40-45% with grade 1dFx on 02/19/2016; who presents with complaints of left-sided chest pain. Symptoms started sometime yesterday morning and have been constant. Pain is dull aching pain on the left lower lung field that she rates as a 1-2 out of 10 on the pain scale. Associated symptoms include low-grade temp for which the nurse noted to be 99.8F, and some sinus drainage. Denies any significant fever, chills, nausea, vomiting, diarrhea, headache, loss of consciousness, lightheadedness, sick contacts, leg swelling, calf pain. Symptoms felt similar to pain she had during her last hospitalization. Just recently hospitalized from 1/8-1/11 at [facility] after being found to have a HCAP with left pleural effusion requiring thoracentesis, and acute right upper lobe pulmonary embolus started on anticoagulation of Eliquis.  she admits to taking medications as advised.   ED Course: While in the ED patient was noted to have heart rates 70 -115, respirations 17-38, blood pressure as low as 94/66, and O2 saturations maintained on room air. Orthostatic vital signs were positive. Patient's EKG  showed new signs of inferior ST elevation, but 3 negative troponins. Cardiology was consulted and question the possibility of improper lead placement. but recommended continued monitoring. Chest x-ray showed no specific consolidation or effusion.   Review of Systems: As per HPI otherwise 10 point review of systems negative.   Past Medical History:  Diagnosis Date  . Arthritis   . Elevated cholesterol   . Hearing deficit   . Hypertension   . Vaginal pessary present     Past  Surgical History:  Procedure Laterality Date  . CARDIAC CATHETERIZATION    . CHOLECYSTECTOMY    . VAGINAL HYSTERECTOMY     Leiomyomata/bleeding     reports that she has never smoked. She has never used smokeless tobacco. She reports that she drinks alcohol. She reports that she does not use drugs.  No Known Allergies  Family History  Problem Relation Age of Onset  . Cancer Mother     Intestinal/Liver  . Hypertension Mother   . Hypertension Father   . Heart attack Father   . Hypertension Brother   . Heart disease Brother     Prior to Admission medications   Medication Sig Start Date End Date Taking? Authorizing Provider  potassium chloride SA (K-DUR,KLOR-CON) 20 MEQ tablet Take 20 mEq by mouth daily.   Yes Historical Provider, MD  apixaban (ELIQUIS) 5 MG TABS tablet Take 10mg  (two tablets) twice daily for 7 days, then take 5mg  (one tablet) twice daily. 02/20/16   Narda Bonds, MD  aspirin 81 MG tablet Take 81 mg by mouth daily.    Historical Provider, MD  atorvastatin (LIPITOR) 20 MG tablet Take 20 mg by mouth daily. 02/01/16   Historical Provider, MD  Calcium-Magnesium-Vitamin D (CALCIUM 500 PO) Take 1 tablet by mouth daily.    Historical Provider, MD  carvedilol (COREG) 3.125 MG tablet Take 3.125 mg by mouth 2 (two) times daily. 01/31/16   Historical Provider, MD  cholecalciferol (VITAMIN D) 1000 UNITS tablet Take 1,000 Units by mouth every morning.     Historical Provider, MD  estradiol (ESTRACE) 0.1 MG/GM vaginal cream Use 1/4 applicator twice weekly  Patient taking differently: Use 1/4 applicator vaginally twice a week 11/05/14   Dara Lords, MD  furosemide (LASIX) 40 MG tablet Take 40 mg by mouth daily. 02/06/16   Historical Provider, MD  lisinopril (PRINIVIL,ZESTRIL) 40 MG tablet Take 40 mg by mouth daily.    Historical Provider, MD  Multiple Vitamins-Minerals ([employer]S SPECTRAVITE ADULT 50+) TABS Take 1 tablet by mouth daily.    Historical Provider, MD  nitroGLYCERIN  (NITRODUR - DOSED IN MG/24 HR) 0.2 mg/hr patch Place 1 patch onto the skin daily. 02/01/16   Historical Provider, MD  PARoxetine (PAXIL) 20 MG tablet Take 20 mg by mouth daily.    Historical Provider, MD  polyethylene glycol (MIRALAX / GLYCOLAX) packet Take 17 g by mouth daily as needed. Patient taking differently: Take 17 g by mouth daily as needed for mild constipation.  02/20/16   Narda Bonds, MD  ticagrelor (BRILINTA) 90 MG TABS tablet Take 90 mg by mouth 2 (two) times daily.    Historical Provider, MD    Physical Exam:  Constitutional: NAD, calm, comfortable Vitals:   03/11/16 2100 03/11/16 2200 03/11/16 2230 03/11/16 2300  BP: 118/67 130/73 (!) 116/53 118/62  Pulse: 99  100 107  Resp: (!) 27 22 (!) 38 (!) 28  SpO2: 96%  99% 98%   Eyes: PERRL, lids and conjunctivae normal ENMT: Mucous membranes are moist. Posterior pharynx clear of any exudate or lesions.Normal dentition.  Neck: normal, supple, no masses, no thyromegaly Respiratory: clear to auscultation bilaterally, no wheezing, no crackles. Normal respiratory effort. No accessory muscle use.  Cardiovascular: Regular rate and rhythm, no murmurs / rubs / gallops. No extremity edema. 2+ pedal pulses. No carotid bruits.  Abdomen: no tenderness, no masses palpated. No hepatosplenomegaly. Bowel sounds positive.  Musculoskeletal: no clubbing / cyanosis. No joint deformity upper and lower extremities. Good ROM, no contractures. Normal muscle tone.  Skin: no rashes, lesions, ulcers. No induration Neurologic: CN 2-12 grossly intact. Sensation intact, DTR normal. Strength 5/5 in all 4.  Psychiatric: Normal judgment and insight. Alert and oriented x 3. Normal mood.     Labs on Admission: I have personally reviewed following labs and imaging studies  CBC:  Recent Labs Lab 03/11/16 1730  WBC 13.9*  HGB 10.5*  HCT 32.5*  MCV 88.6  PLT 269   Basic Metabolic Panel:  Recent Labs Lab 03/11/16 1730  NA 132*  K 3.8  CL 98*  CO2  24  GLUCOSE 129*  BUN 17  CREATININE 1.03*  CALCIUM 9.0   GFR: CrCl cannot be calculated (Unknown ideal weight.). Liver Function Tests: No results for input(s): AST, ALT, ALKPHOS, BILITOT, PROT, ALBUMIN in the last 168 hours. No results for input(s): LIPASE, AMYLASE in the last 168 hours. No results for input(s): AMMONIA in the last 168 hours. Coagulation Profile: No results for input(s): INR, PROTIME in the last 168 hours. Cardiac Enzymes: No results for input(s): CKTOTAL, CKMB, CKMBINDEX, TROPONINI in the last 168 hours. BNP (last 3 results) No results for input(s): PROBNP in the last 8760 hours. HbA1C: No results for input(s): HGBA1C in the last 72 hours. CBG: No results for input(s): GLUCAP in the last 168 hours. Lipid Profile: No results for input(s): CHOL, HDL, LDLCALC, TRIG, CHOLHDL, LDLDIRECT in the last 72 hours. Thyroid Function Tests: No results for input(s): TSH, T4TOTAL, FREET4, T3FREE, THYROIDAB in the last 72 hours. Anemia Panel: No results for input(s): VITAMINB12, FOLATE, FERRITIN, TIBC, IRON, RETICCTPCT in the last 72 hours. Urine analysis:  Component Value Date/Time   COLORURINE YELLOW 11/01/2013 1633   APPEARANCEUR CLOUDY (A) 11/01/2013 1633   LABSPEC >1.030 (H) 11/01/2013 1633   PHURINE 6.0 11/01/2013 1633   GLUCOSEU NEG 11/01/2013 1633   HGBUR MOD (A) 11/01/2013 1633   BILIRUBINUR NEG 11/01/2013 1633   KETONESUR NEG 11/01/2013 1633   PROTEINUR 30 (A) 11/01/2013 1633   UROBILINOGEN 0.2 11/01/2013 1633   NITRITE NEG 11/01/2013 1633   LEUKOCYTESUR LARGE (A) 11/01/2013 1633   Sepsis Labs: No results found for this or any previous visit (from the past 240 hour(s)).   Radiological Exams on Admission: Dg Chest 2 View  Result Date: 03/11/2016 CLINICAL DATA:  Pleuritic left-sided chest wall pain for 2 days. Recent thoracentesis on the left. EXAM: CHEST  2 VIEW COMPARISON:  02/19/2016 FINDINGS: Minimal curvilinear opacities in the left base probably  represents scarring or atelectasis. Similar curvilinear opacities are present to a lesser degree on the right. No confluent consolidation. No significant effusion. Normal pulmonary vasculature. Unchanged mild cardiomegaly. Hilar and mediastinal contours are unremarkable and unchanged. IMPRESSION: Mild curvilinear scarring or atelectasis in the lung bases. No significant effusion. No consolidation. Electronically Signed   By: Ellery Plunkaniel R Mitchell M.D.   On: 03/11/2016 18:10    EKG: Independently reviewed. Sinus rhythm with signs of inferior ST elevation  Assessment/Plan Chest pain with abnormal EKG: Patient presented with complaints of left-sided chest pain. - Admit to telemetry bed - Recheck troponin 1, as previously has had 3 negative troponins while in the ED  - check CT angiogram of chest - Consider need of cardiology consult  SIRS - Sepsis order set initiated, without fluid bolus due to CHF history - Check respiratory viral panel, urinalysis, and blood cultures - check Lactic acid - Empiric antibiotics of vancomycin and Zosyn for possible sepsis of unknown  - IVF NS at 75 ml/hr   Leukocytosis WBC elevated at 13.9 - Recheck CBC in a.m.    Orthostatic hypotension - check orthostatic vital signs  Systolic congestive heart failure: EF 40-45 - Checking BNP  Pulmonary embolus - Continue Eliquis   Essential hypertension - will need to restart home blood pressure medications when medically appropriate to do so  Hyperlipidemia - continue Atorvastatin    Depression  - Continue home meds  Hyponatremia: Chronic. Patient's initial sodium was 132. - Continue to monitor  DVT Prophylaxis: Eliquis Code Status:  Full Family Communication:  no family present at bedside  Disposition Plan: Discharged back to independent living once medically stable  Consults called: none Admission status: Observation  Clydie Braunondell A Smith MD [facility] Pager [phone]  If 7PM-7AM, please  contact night-coverage [email or web address] Password [email or web address]  03/11/2016, 11:44 PM

## 2016-03-11 NOTE — ED Provider Notes (Signed)
[facility]-EMERGENCY DEPT Provider Note   CSN: [id number] Arrival date & time: 03/11/16  1703     History   Chief Complaint Chief Complaint  Patient presents with  . Shortness of Breath  . Chest Pain    HPI Kristen Lewis is a 83 y.o. female.  HPI 83 year old female with a history of CAD, CHF who was recently admitted for a left-sided pleural effusion and an acute PE. She was placed on Eliquis for her PE. She states that she has had left-sided pleuritic chest pain since this morning. She states that she has a dull aching pain in her left lower chest when she takes a deep breath and SOB. She denies pain at rest or when breathing normally. She states that last time she was admitted for the pleural effusion this is how she felt therefore she wanted to make sure that she did not have a pleural effusion again. Pain does not radiate. No associated nausea or diaphoresis. Denies fevers, cough, sputum production.   Past Medical History:  Diagnosis Date  . Arthritis   . Elevated cholesterol   . Hearing deficit   . Hypertension   . Vaginal pessary present     Patient Active Problem List   Diagnosis Date Noted  . Pulmonary embolus (HCC) 02/18/2016  . Pleural effusion 02/18/2016  . Hypokalemia 02/18/2016  . Hyponatremia 02/18/2016  . HCAP (healthcare-associated pneumonia) 02/17/2016  . CAD (coronary artery disease)  02/17/2016  . CHF (congestive heart failure) (HCC) 02/17/2016  . Essential hypertension 02/17/2016  . Hyperlipidemia 02/17/2016  . Pneumonia 02/17/2016    Past Surgical History:  Procedure Laterality Date  . CARDIAC CATHETERIZATION    . CHOLECYSTECTOMY    . VAGINAL HYSTERECTOMY     Leiomyomata/bleeding    OB History    Gravida Para Term Preterm AB Living   4 3 3   1 3    SAB TAB Ectopic Multiple Live Births   1               Home Medications    Prior to Admission medications   Medication Sig Start Date End Date Taking? Authorizing Provider  potassium  chloride SA (K-DUR,KLOR-CON) 20 MEQ tablet Take 20 mEq by mouth daily.   Yes Historical Provider, MD  apixaban (ELIQUIS) 5 MG TABS tablet Take 10mg  (two tablets) twice daily for 7 days, then take 5mg  (one tablet) twice daily. 02/20/16   Narda Bonds, MD  aspirin 81 MG tablet Take 81 mg by mouth daily.    Historical Provider, MD  atorvastatin (LIPITOR) 20 MG tablet Take 20 mg by mouth daily. 02/01/16   Historical Provider, MD  Calcium-Magnesium-Vitamin D (CALCIUM 500 PO) Take 1 tablet by mouth daily.    Historical Provider, MD  carvedilol (COREG) 3.125 MG tablet Take 3.125 mg by mouth 2 (two) times daily. 01/31/16   Historical Provider, MD  cholecalciferol (VITAMIN D) 1000 UNITS tablet Take 1,000 Units by mouth every morning.     Historical Provider, MD  estradiol (ESTRACE) 0.1 MG/GM vaginal cream Use 1/4 applicator twice weekly Patient taking differently: Use 1/4 applicator vaginally twice a week 11/05/14   Dara Lords, MD  furosemide (LASIX) 40 MG tablet Take 40 mg by mouth daily. 02/06/16   Historical Provider, MD  lisinopril (PRINIVIL,ZESTRIL) 40 MG tablet Take 40 mg by mouth daily.    Historical Provider, MD  Multiple Vitamins-Minerals ([employer]S SPECTRAVITE ADULT 50+) TABS Take 1 tablet by mouth daily.    Historical Provider, MD  nitroGLYCERIN (NITRODUR - DOSED IN MG/24 HR) 0.2 mg/hr patch Place 1 patch onto the skin daily. 02/01/16   Historical Provider, MD  PARoxetine (PAXIL) 20 MG tablet Take 20 mg by mouth daily.    Historical Provider, MD  polyethylene glycol (MIRALAX / GLYCOLAX) packet Take 17 g by mouth daily as needed. Patient taking differently: Take 17 g by mouth daily as needed for mild constipation.  02/20/16   Narda Bonds, MD  ticagrelor (BRILINTA) 90 MG TABS tablet Take 90 mg by mouth 2 (two) times daily.    Historical Provider, MD    Family History Family History  Problem Relation Age of Onset  . Cancer Mother     Intestinal/Liver  . Hypertension Mother   .  Hypertension Father   . Heart attack Father   . Hypertension Brother   . Heart disease Brother     Social History Social History  Substance Use Topics  . Smoking status: Never Smoker  . Smokeless tobacco: Never Used  . Alcohol use 0.0 oz/week     Comment: Rare     Allergies   Patient has no known allergies.   Review of Systems Review of Systems  Constitutional: Negative for chills and fever.  HENT: Negative for ear pain and sore throat.   Eyes: Negative for pain and visual disturbance.  Respiratory: Negative for cough and shortness of breath.   Cardiovascular: Positive for chest pain. Negative for palpitations.  Gastrointestinal: Negative for abdominal pain and vomiting.  Genitourinary: Negative for dysuria and hematuria.  Musculoskeletal: Negative for arthralgias and back pain.  Skin: Negative for color change and rash.  Neurological: Negative for seizures and syncope.  All other systems reviewed and are negative.    Physical Exam Updated Vital Signs BP 94/66   Pulse 97   Resp (!) 29   SpO2 97%   Physical Exam  Constitutional: She is oriented to person, place, and time. She appears well-developed and well-nourished. No distress.  HENT:  Head: Normocephalic and atraumatic.  Eyes: Conjunctivae are normal.  Neck: Normal range of motion. Neck supple.  Cardiovascular: Normal rate, regular rhythm and normal heart sounds.   No murmur heard. Pulmonary/Chest: Effort normal. No respiratory distress. She has no decreased breath sounds. She has rales in the right lower field and the left lower field.  Abdominal: Soft. She exhibits no distension. There is no tenderness.  Musculoskeletal: She exhibits no edema.  Neurological: She is alert and oriented to person, place, and time.  Skin: Skin is warm and dry. Capillary refill takes less than 2 seconds.  Psychiatric: She has a normal mood and affect.  Nursing note and vitals reviewed.    ED Treatments / Results   Labs (all labs ordered are listed, but only abnormal results are displayed) Labs Reviewed  BASIC METABOLIC PANEL - Abnormal; Notable for the following:       Result Value   Sodium 132 (*)    Chloride 98 (*)    Glucose, Bld 129 (*)    Creatinine, Ser 1.03 (*)    GFR calc non Af Amer 47 (*)    GFR calc Af Amer 55 (*)    All other components within normal limits  CBC - Abnormal; Notable for the following:    WBC 13.9 (*)    RBC 3.67 (*)    Hemoglobin 10.5 (*)    HCT 32.5 (*)    All other components within normal limits  I-STAT TROPOININ, ED  Rosezena Sensor, ED  EKG  EKG Interpretation  Date/Time:  Wednesday March 11 2016 20:42:21 EST Ventricular Rate:  92 PR Interval:    QRS Duration: 93 QT Interval:  382 QTC Calculation: 473 R Axis:   24 Text Interpretation:  Sinus rhythm Atrial premature complexes in couplets Probable lateral infarct, age indeterminate ST elevation, consider inferior injury Confirmed by Lincoln Brighamees, Liz [phone]([id number]) on 03/11/2016 8:48:27 PM       Radiology Dg Chest 2 View  Result Date: 03/11/2016 CLINICAL DATA:  Pleuritic left-sided chest wall pain for 2 days. Recent thoracentesis on the left. EXAM: CHEST  2 VIEW COMPARISON:  02/19/2016 FINDINGS: Minimal curvilinear opacities in the left base probably represents scarring or atelectasis. Similar curvilinear opacities are present to a lesser degree on the right. No confluent consolidation. No significant effusion. Normal pulmonary vasculature. Unchanged mild cardiomegaly. Hilar and mediastinal contours are unremarkable and unchanged. IMPRESSION: Mild curvilinear scarring or atelectasis in the lung bases. No significant effusion. No consolidation. Electronically Signed   By: Ellery Plunkaniel R Mitchell M.D.   On: 03/11/2016 18:10    Procedures Procedures (including critical care time)  Medications Ordered in ED Medications  sodium chloride 0.9 % bolus 500 mL (not administered)     Initial Impression / Assessment and  Plan / ED Course  I have reviewed the triage vital signs and the nursing notes.  Pertinent labs & imaging results that were available during my care of the patient were reviewed by me and considered in my medical decision making (see chart for details).    83 year old female with a history of hypertension, CAD, CHF, pulmonary embolism found during her last admission was on Eliquis, presenting with left lateral chest pain when she takes deep breath. She denies pain at rest. She states this feels similar to the last time she was admitted and needed a thoracentesis. She states that she had a cath in November in [place] but did not have any stents placed. This is per her report and no medical records from [place] present. EKG with inverted T waves and new Q waves in V5 and V6 when compared to prior EKGs. Mild atelectasis on chest x-ray however no significant effusion or focal consolidation. Labs notable for a leukocytosis of 13.9 however BMPs grossly similar to baseline. Initial troponin negative. Patient persistently tachycardic to the 110s with pleuritic chest pain however she is already being treated for Pe.  No signs of pneumonia or pneumothorax on chest x-ray. Pulses equal bilaterally and no chest pain at rest, without dissection. Pain is atypical and unlikely ACS however due to new EKG changes cardiology was consulted. Cardiology was at the EKG and states that the EKG changes are likely lead placement and because she has 2 negative troponins do not believe that cath is warranted at this time. Patient found to have orthostatic hypotension with increased heart rate blood pressure in the 80s when she stands. 500 mL fluid bolus given and patient will be admitted to the hospitalist for further workup of EKG changes and orthostatic hypotension as ACS cannot be completely ruled out at this time.  Patient care discussed and supervised by my attending, Dr. Madilyn Hookees. Azalia BilisNathan Ashelynn Marks, MD   Final Clinical  Impressions(s) / ED Diagnoses   Final diagnoses:  None    New Prescriptions New Prescriptions   No medications on file     Audine Mangione Italyhad Musa Rewerts, MD 03/12/16 0041    Tilden FossaElizabeth Rees, MD 03/17/16 [phone]

## 2016-03-11 NOTE — ED Notes (Signed)
EKG given to Dr. Rees 

## 2016-03-11 NOTE — ED Triage Notes (Signed)
Per EMS:  [facility].  Pt has been treated for pneumonia since November.  Today patient began to experience left-sided rib tenderness with deep inspiration and increasing shortness of breath with exertion.  Pt has no complaints at rest.  Pt states on 1/11 she had 5 liters of fluid "drained off of me" due to CHF.  Pt tachycardic en route, denies pain without inspiration.

## 2016-03-11 NOTE — ED Notes (Signed)
Ambulated patient in the hallway patient maintain between 98-100 percent pulse was between 112 and 116.Marland Kitchen. Patient stated no dizziness walking.Marland Kitchen..Marland Kitchen

## 2016-03-12 ENCOUNTER — Encounter (HOSPITAL_COMMUNITY): Payer: Self-pay

## 2016-03-12 ENCOUNTER — Observation Stay (HOSPITAL_COMMUNITY): Payer: Medicare Other

## 2016-03-12 DIAGNOSIS — I509 Heart failure, unspecified: Secondary | ICD-10-CM | POA: Diagnosis not present

## 2016-03-12 DIAGNOSIS — R071 Chest pain on breathing: Secondary | ICD-10-CM

## 2016-03-12 DIAGNOSIS — R079 Chest pain, unspecified: Secondary | ICD-10-CM | POA: Diagnosis not present

## 2016-03-12 DIAGNOSIS — I2510 Atherosclerotic heart disease of native coronary artery without angina pectoris: Secondary | ICD-10-CM | POA: Diagnosis not present

## 2016-03-12 DIAGNOSIS — I2699 Other pulmonary embolism without acute cor pulmonale: Secondary | ICD-10-CM | POA: Diagnosis not present

## 2016-03-12 DIAGNOSIS — R6510 Systemic inflammatory response syndrome (SIRS) of non-infectious origin without acute organ dysfunction: Secondary | ICD-10-CM | POA: Diagnosis present

## 2016-03-12 DIAGNOSIS — I10 Essential (primary) hypertension: Secondary | ICD-10-CM | POA: Diagnosis not present

## 2016-03-12 DIAGNOSIS — A419 Sepsis, unspecified organism: Secondary | ICD-10-CM | POA: Diagnosis not present

## 2016-03-12 DIAGNOSIS — D72829 Elevated white blood cell count, unspecified: Secondary | ICD-10-CM | POA: Diagnosis present

## 2016-03-12 DIAGNOSIS — I110 Hypertensive heart disease with heart failure: Secondary | ICD-10-CM | POA: Diagnosis not present

## 2016-03-12 LAB — I-STAT TROPONIN, ED: Troponin i, poc: 0.01 ng/mL (ref 0.00–0.08)

## 2016-03-12 LAB — BASIC METABOLIC PANEL
Anion gap: 6 (ref 5–15)
BUN: 13 mg/dL (ref 6–20)
CALCIUM: 8.5 mg/dL — AB (ref 8.9–10.3)
CHLORIDE: 105 mmol/L (ref 101–111)
CO2: 24 mmol/L (ref 22–32)
CREATININE: 0.89 mg/dL (ref 0.44–1.00)
GFR calc Af Amer: >60 mL/min (ref >60)
GFR calc non Af Amer: 56 mL/min — ABNORMAL LOW (ref >60)
Glucose, Bld: 105 mg/dL — ABNORMAL HIGH (ref 65–99)
Potassium: 3.5 mmol/L (ref 3.5–5.1)
SODIUM: 135 mmol/L (ref 135–145)

## 2016-03-12 LAB — URINALYSIS, ROUTINE W REFLEX MICROSCOPIC
BILIRUBIN URINE: NEGATIVE
GLUCOSE, UA: 50 mg/dL — AB
KETONES UR: NEGATIVE mg/dL
LEUKOCYTES UA: NEGATIVE
NITRITE: NEGATIVE
PH: 6 (ref 5.0–8.0)
Protein, ur: NEGATIVE mg/dL
Specific Gravity, Urine: 1.019 (ref 1.005–1.030)

## 2016-03-12 LAB — PROCALCITONIN

## 2016-03-12 LAB — RESPIRATORY PANEL BY PCR
ADENOVIRUS-RVPPCR: NOT DETECTED
Bordetella pertussis: NOT DETECTED
CHLAMYDOPHILA PNEUMONIAE-RVPPCR: NOT DETECTED
CORONAVIRUS NL63-RVPPCR: NOT DETECTED
CORONAVIRUS OC43-RVPPCR: NOT DETECTED
Coronavirus 229E: NOT DETECTED
Coronavirus HKU1: NOT DETECTED
INFLUENZA A-RVPPCR: NOT DETECTED
Influenza B: NOT DETECTED
Metapneumovirus: NOT DETECTED
Mycoplasma pneumoniae: NOT DETECTED
PARAINFLUENZA VIRUS 1-RVPPCR: NOT DETECTED
PARAINFLUENZA VIRUS 3-RVPPCR: NOT DETECTED
PARAINFLUENZA VIRUS 4-RVPPCR: NOT DETECTED
Parainfluenza Virus 2: NOT DETECTED
RESPIRATORY SYNCYTIAL VIRUS-RVPPCR: NOT DETECTED
RHINOVIRUS / ENTEROVIRUS - RVPPCR: NOT DETECTED

## 2016-03-12 LAB — TSH: TSH: 4.051 u[IU]/mL (ref 0.350–4.500)

## 2016-03-12 LAB — CBC
HCT: 29.7 % — ABNORMAL LOW (ref 36.0–46.0)
HEMOGLOBIN: 9.5 g/dL — AB (ref 12.0–15.0)
MCH: 28.1 pg (ref 26.0–34.0)
MCHC: 32 g/dL (ref 30.0–36.0)
MCV: 87.9 fL (ref 78.0–100.0)
Platelets: 220 10*3/uL (ref 150–400)
RBC: 3.38 MIL/uL — ABNORMAL LOW (ref 3.87–5.11)
RDW: 15.5 % (ref 11.5–15.5)
WBC: 10.5 10*3/uL (ref 4.0–10.5)

## 2016-03-12 LAB — LACTIC ACID, PLASMA: Lactic Acid, Venous: 1.1 mmol/L (ref 0.5–1.9)

## 2016-03-12 LAB — BRAIN NATRIURETIC PEPTIDE: B Natriuretic Peptide: 637.2 pg/mL — ABNORMAL HIGH (ref 0.0–100.0)

## 2016-03-12 LAB — TROPONIN I

## 2016-03-12 MED ORDER — APIXABAN 5 MG PO TABS
5.0000 mg | ORAL_TABLET | Freq: Two times a day (BID) | ORAL | Status: DC
  Administered 2016-03-12 – 2016-03-13 (×3): 5 mg via ORAL
  Filled 2016-03-12 (×3): qty 1

## 2016-03-12 MED ORDER — ALBUTEROL SULFATE (2.5 MG/3ML) 0.083% IN NEBU: 2.5000 mg | INHALATION_SOLUTION | RESPIRATORY_TRACT | Status: DC | PRN

## 2016-03-12 MED ORDER — ACETAMINOPHEN 325 MG PO TABS: 650.0000 mg | ORAL_TABLET | Freq: Four times a day (QID) | ORAL | Status: DC | PRN

## 2016-03-12 MED ORDER — NITROGLYCERIN 0.2 MG/HR TD PT24
0.2000 mg | MEDICATED_PATCH | TRANSDERMAL | Status: DC
Start: 2016-03-12 — End: 2016-03-13
  Administered 2016-03-12: 0.2 mg via TRANSDERMAL
  Filled 2016-03-12 (×2): qty 1

## 2016-03-12 MED ORDER — ONDANSETRON HCL 4 MG/2ML IJ SOLN: 4.0000 mg | Freq: Four times a day (QID) | INTRAMUSCULAR | Status: DC | PRN

## 2016-03-12 MED ORDER — PIPERACILLIN-TAZOBACTAM 3.375 G IVPB
3.3750 g | Freq: Three times a day (TID) | INTRAVENOUS | Status: DC
  Filled 2016-03-12 (×3): qty 50

## 2016-03-12 MED ORDER — FUROSEMIDE 10 MG/ML IJ SOLN
20.0000 mg | INTRAMUSCULAR | Status: AC
  Administered 2016-03-12: 20 mg via INTRAVENOUS
  Filled 2016-03-12: qty 2

## 2016-03-12 MED ORDER — ASPIRIN 81 MG PO CHEW
81.0000 mg | CHEWABLE_TABLET | Freq: Every day | ORAL | Status: DC
  Administered 2016-03-12: 81 mg via ORAL
  Filled 2016-03-12: qty 1

## 2016-03-12 MED ORDER — BISACODYL 10 MG RE SUPP: 10.0000 mg | Freq: Every day | RECTAL | Status: DC | PRN

## 2016-03-12 MED ORDER — PIPERACILLIN-TAZOBACTAM 3.375 G IVPB 30 MIN
3.3750 g | Freq: Once | INTRAVENOUS | Status: AC
  Administered 2016-03-12: 3.375 g via INTRAVENOUS
  Filled 2016-03-12: qty 50

## 2016-03-12 MED ORDER — VANCOMYCIN HCL IN DEXTROSE 1-5 GM/200ML-% IV SOLN: 1000.0000 mg | Freq: Once | INTRAVENOUS | Status: DC

## 2016-03-12 MED ORDER — ACETAMINOPHEN 650 MG RE SUPP: 650.0000 mg | Freq: Four times a day (QID) | RECTAL | Status: DC | PRN

## 2016-03-12 MED ORDER — FUROSEMIDE 40 MG PO TABS
40.0000 mg | ORAL_TABLET | Freq: Every day | ORAL | Status: DC
  Administered 2016-03-12 – 2016-03-13 (×2): 40 mg via ORAL
  Filled 2016-03-12 (×2): qty 1

## 2016-03-12 MED ORDER — ONDANSETRON HCL 4 MG PO TABS: 4.0000 mg | ORAL_TABLET | Freq: Four times a day (QID) | ORAL | Status: DC | PRN

## 2016-03-12 MED ORDER — ATORVASTATIN CALCIUM 20 MG PO TABS
20.0000 mg | ORAL_TABLET | Freq: Every day | ORAL | Status: DC
  Administered 2016-03-12 – 2016-03-13 (×2): 20 mg via ORAL
  Filled 2016-03-12 (×2): qty 1

## 2016-03-12 MED ORDER — SODIUM CHLORIDE 0.9 % IV SOLN
INTRAVENOUS | Status: DC
  Administered 2016-03-12: 04:00:00 via INTRAVENOUS

## 2016-03-12 MED ORDER — VANCOMYCIN HCL IN DEXTROSE 1-5 GM/200ML-% IV SOLN
1000.0000 mg | INTRAVENOUS | Status: DC
  Administered 2016-03-12: 1000 mg via INTRAVENOUS
  Filled 2016-03-12: qty 200

## 2016-03-12 MED ORDER — PAROXETINE HCL 20 MG PO TABS
20.0000 mg | ORAL_TABLET | Freq: Two times a day (BID) | ORAL | Status: DC
  Administered 2016-03-12 – 2016-03-13 (×3): 20 mg via ORAL
  Filled 2016-03-12 (×3): qty 1

## 2016-03-12 MED ORDER — PIPERACILLIN-TAZOBACTAM 3.375 G IVPB 30 MIN: 3.3750 g | Freq: Once | INTRAVENOUS | Status: DC

## 2016-03-12 MED ORDER — POLYETHYLENE GLYCOL 3350 17 G PO PACK
17.0000 g | PACK | Freq: Every day | ORAL | Status: DC
  Administered 2016-03-12 – 2016-03-13 (×2): 17 g via ORAL
  Filled 2016-03-12 (×2): qty 1

## 2016-03-12 MED ORDER — TICAGRELOR 90 MG PO TABS: 90.0000 mg | ORAL_TABLET | Freq: Two times a day (BID) | ORAL | Status: DC

## 2016-03-12 MED ORDER — ENSURE ENLIVE PO LIQD
237.0000 mL | Freq: Two times a day (BID) | ORAL | Status: DC
  Administered 2016-03-12 – 2016-03-13 (×4): 237 mL via ORAL

## 2016-03-12 MED ORDER — CARVEDILOL 3.125 MG PO TABS
3.1250 mg | ORAL_TABLET | Freq: Two times a day (BID) | ORAL | Status: DC
  Administered 2016-03-12 – 2016-03-13 (×2): 3.125 mg via ORAL
  Filled 2016-03-12 (×3): qty 1

## 2016-03-12 MED ORDER — TRAMADOL HCL 50 MG PO TABS: 50.0000 mg | ORAL_TABLET | Freq: Two times a day (BID) | ORAL | Status: DC | PRN

## 2016-03-12 MED ORDER — CLOPIDOGREL BISULFATE 75 MG PO TABS
75.0000 mg | ORAL_TABLET | Freq: Every day | ORAL | Status: DC
  Administered 2016-03-12 – 2016-03-13 (×2): 75 mg via ORAL
  Filled 2016-03-12 (×2): qty 1

## 2016-03-12 MED ORDER — IOPAMIDOL (ISOVUE-370) INJECTION 76%
INTRAVENOUS | Status: AC
  Administered 2016-03-12: 100 mL via INTRAVENOUS
  Filled 2016-03-12: qty 100

## 2016-03-12 NOTE — ED Notes (Signed)
Patient transported to CT 

## 2016-03-12 NOTE — Progress Notes (Signed)
PROGRESS NOTE    Kristen Lewis  ZOX:[id number] DOB: [birth year] DOA: 03/11/2016 PCP: Gaspar Garbe, MD Outpatient Specialists: Gynecologist- Dr. Colin Broach Cardiologist- Dr. Olga Millers   Brief Narrative:  Kristen Lewis is an 83 year old female with a PMH of HTN, systolic CHF (last EF 40-45%), hx PE 02/2016 on Eliquis presenting to the ED with constant, dull, achy, left sided chest pain. She was just recently hospitalized at [facility] from 1/8-1/11 with HCAP with left pleural effusion requiring thoracentesis and acute right upper lobe PE. In the ED, she was meeting SIRS criteria with tachycardia to 115, tachypnea to 38, mildly hypotension to 94/66. She was maintaining O2 sats on RA. She was started on vanc/zosyn. EKG showed new ST elevations in the inferior leads, but trops were negative x 3. CXR negative. CTA chest negative for acute changes.   Assessment & Plan:   Principal Problem:   Chest pain Active Problems:   CHF (congestive heart failure) (HCC)   Essential hypertension   Hyperlipidemia   Pulmonary embolus (HCC)   Hyponatremia   Leukocytosis   SIRS (systemic inflammatory response syndrome) (HCC)  Chest pain: EKG with new ST elevations in inferior leads. Troponin negative x 4. CTA chest negative for new findings. Per patient, she had a cardiac cath in [place] in Nov 2017 and did not have any stents placed. - Repeat EKG this morning without ST elevations - Continue Eliquis - Pt is requesting a cardiology consult at this time, although her chest pain seems to be more pleuritic in nature. Pt has follow-up appointment scheduled with Dr. Jens Som on 03/16/16. Pt also noted to be on both Eliquis and Brilinta, but she states she did not have a stent placed during her last cardiac cath in Nov 2017 in [place]. Do not know if she needs to stay on Brilinta.  SIRS: Resolved. WBC trended down from 13.9 > 10.5. CXR and CTA chest without signs of pneumonia. No shortness of breath, no  dysuria. Has had some congestion and nasal drainage over the last few days. - Blood cultures pending - UA pending - Pt started on Vanc/Zosyn. Will stop empiric antibiotics and monitor for signs of infection. - Respiratory viral panel ordered - Holding IVFs with CHF   Orthostatic hypotension: orthostatic vitals positive.  Systolic congestive heart failure: EF 40-45%. BNP 637 this admission. Bibasilar crackles on exam. Weight down 4lb since yesterday. - s/p Lasix 20mg  IV x 1 after becoming tachypneic with IVFs - Restart home Lasix 40mg  daily - Hold IVFs  Pulmonary embolus: Diagnosed 1/9. - Continue Eliquis   Essential hypertension - Restart Coreg - Lisinopril held on admission. Monitor BP and restart as needed. May need lower dose.  Hyperlipidemia - continue Atorvastatin    Depression  - Continue Paxil 20mg  daily  Hyponatremia: Chronic. Patient's initial sodium was 132. - Continue to monitor   DVT prophylaxis: Eliquis Code Status: FULL Family Communication: No family in the room. Kristen Lewis lives in [place]. Disposition Plan: Will observe overnight. Plan for discharge home tomorrow.   Consultants:   Cardiology  Procedures:   None  Antimicrobials:   Vanc/Zosyn (1/31 - )   Subjective: Pt states she is doing fine this morning. She continues to have chest pain only when she takes a deep breath. No shortness of breath. No dizziness. No dysuria.  Objective: Vitals:   03/12/16 0200 03/12/16 0230 03/12/16 0230 03/12/16 0348  BP: (!) 114/51 107/58  139/77  Pulse: 75 94  98  Resp:  18 (!) 30    Temp:   98.5 F (36.9 C) 98.4 F (36.9 C)  TempSrc:   Oral Oral  SpO2: 93% 96%  98%  Weight:    134 lb 4.8 oz (60.9 kg)  Height:    5\' 2"  (1.575 m)    Intake/Output Summary (Last 24 hours) at 03/12/16 0829 Last data filed at 03/12/16 0349  Gross per 24 hour  Intake              500 ml  Output              100 ml  Net              400 ml   Filed Weights    03/12/16 0100 03/12/16 0348  Weight: 138 lb 3.7 oz (62.7 kg) 134 lb 4.8 oz (60.9 kg)    Examination:  General exam: Appears calm and comfortable  Respiratory system: Normal work of breathing. Bibasilar crackles. Cardiovascular system: S1 & S2 heard, RRR. No JVD, murmurs, rubs, gallops or clicks. No pedal edema. Gastrointestinal system: Abdomen is nondistended, soft and nontender. No organomegaly or masses felt. Normal bowel sounds heard. Central nervous system: Alert and oriented. No focal neurological deficits. Extremities: Symmetric 5/5 strength. Skin: No rashes, lesions or ulcers Psychiatry: Judgement and insight appear normal. Mood & affect appropriate.     Data Reviewed: I have personally reviewed following labs and imaging studies  CBC:  Recent Labs Lab 03/11/16 1730 03/12/16 0508  WBC 13.9* 10.5  HGB 10.5* 9.5*  HCT 32.5* 29.7*  MCV 88.6 87.9  PLT 269 220   Basic Metabolic Panel:  Recent Labs Lab 03/11/16 1730 03/12/16 0508  NA 132* 135  K 3.8 3.5  CL 98* 105  CO2 24 24  GLUCOSE 129* 105*  BUN 17 13  CREATININE 1.03* 0.89  CALCIUM 9.0 8.5*   GFR: Estimated Creatinine Clearance: 37.5 mL/min (by C-G formula based on SCr of 0.89 mg/dL). Liver Function Tests: No results for input(s): AST, ALT, ALKPHOS, BILITOT, PROT, ALBUMIN in the last 168 hours. No results for input(s): LIPASE, AMYLASE in the last 168 hours. No results for input(s): AMMONIA in the last 168 hours. Coagulation Profile: No results for input(s): INR, PROTIME in the last 168 hours. Cardiac Enzymes:  Recent Labs Lab 03/12/16 0508  TROPONINI <0.03   BNP (last 3 results) No results for input(s): PROBNP in the last 8760 hours. HbA1C: No results for input(s): HGBA1C in the last 72 hours. CBG: No results for input(s): GLUCAP in the last 168 hours. Lipid Profile: No results for input(s): CHOL, HDL, LDLCALC, TRIG, CHOLHDL, LDLDIRECT in the last 72 hours. Thyroid Function Tests:  Recent  Labs  03/12/16 0508  TSH 4.051   Anemia Panel: No results for input(s): VITAMINB12, FOLATE, FERRITIN, TIBC, IRON, RETICCTPCT in the last 72 hours. Urine analysis:    Component Value Date/Time   COLORURINE YELLOW 11/01/2013 1633   APPEARANCEUR CLOUDY (A) 11/01/2013 1633   LABSPEC >1.030 (H) 11/01/2013 1633   PHURINE 6.0 11/01/2013 1633   GLUCOSEU NEG 11/01/2013 1633   HGBUR MOD (A) 11/01/2013 1633   BILIRUBINUR NEG 11/01/2013 1633   KETONESUR NEG 11/01/2013 1633   PROTEINUR 30 (A) 11/01/2013 1633   UROBILINOGEN 0.2 11/01/2013 1633   NITRITE NEG 11/01/2013 1633   LEUKOCYTESUR LARGE (A) 11/01/2013 1633   Sepsis Labs: Lactic acid: 1.1   Radiology Studies: Dg Chest 2 View  Result Date: 03/11/2016 CLINICAL DATA:  Pleuritic left-sided chest wall pain  for 2 days. Recent thoracentesis on the left. EXAM: CHEST  2 VIEW COMPARISON:  02/19/2016 FINDINGS: Minimal curvilinear opacities in the left base probably represents scarring or atelectasis. Similar curvilinear opacities are present to a lesser degree on the right. No confluent consolidation. No significant effusion. Normal pulmonary vasculature. Unchanged mild cardiomegaly. Hilar and mediastinal contours are unremarkable and unchanged. IMPRESSION: Mild curvilinear scarring or atelectasis in the lung bases. No significant effusion. No consolidation. Electronically Signed   By: Ellery Plunk M.D.   On: 03/11/2016 18:10   Ct Angio Chest Pe W Or Wo Contrast  Result Date: 03/12/2016 CLINICAL DATA:  Pleuritic left-sided chest pain and worsening dyspnea on exertion, onset today. EXAM: CT ANGIOGRAPHY CHEST WITH CONTRAST TECHNIQUE: Multidetector CT imaging of the chest was performed using the standard protocol during bolus administration of intravenous contrast. Multiplanar CT image reconstructions and MIPs were obtained to evaluate the vascular anatomy. CONTRAST:  100 mL Isovue 370 intravenous COMPARISON:  02/18/2016 FINDINGS: Cardiovascular:  Good opacification of the pulmonary vasculature with no evidence of pulmonary embolism. The segmental right upper lobe pulmonary embolus observed on 02/18/2016 is no longer evident. No evidence of right heart strain. Small pericardial effusion persists. The thoracic aorta exhibits moderate atherosclerotic calcification. 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 Mediastinum/Nodes: No enlarged mediastinal, hilar, or axillary lymph nodes. Thyroid gland, trachea, and esophagus demonstrate no significant findings. Lungs/Pleura: Mild atelectatic appearing linear opacities in the bases, left greater than right. Trace left pleural effusion, significantly reduced from 02/18/2016. Upper Abdomen: No acute findings. Musculoskeletal: No significant skeletal lesion. Review of the MIP images confirms the above findings. IMPRESSION: 1. Negative for acute pulmonary embolism. 2. 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 This appears to be unchanged from 02/18/2016. 3. Thoracic aortic atherosclerosis. 4. Small pericardial effusion. 5. Trace left pleural effusion and mild atelectatic appearing linear lung base opacities, reduced from 02/18/2016. Electronically Signed   By: Ellery Plunk M.D.   On: 03/12/2016 03:18        Scheduled Meds: . apixaban  5 mg Oral BID  . aspirin  81 mg Oral Daily  . atorvastatin  20 mg Oral Daily  . feeding supplement (ENSURE ENLIVE)  237 mL Oral BID BM  . piperacillin-tazobactam (ZOSYN)  IV  3.375 g Intravenous Q8H  . vancomycin  1,000 mg Intravenous Q24H   Continuous Infusions:   LOS: 0 days     Hilton Sinclair, MD Family Medicine Resident, PGY-2  If 7PM-7AM, please contact night-coverage [email or web address] Password [email or web address] 03/12/2016, 8:29 AM

## 2016-03-12 NOTE — Progress Notes (Signed)
Pharmacy Antibiotic Note  Kristen Lewis is a 83 y.o. femGreta Lewis admitted on 03/11/2016 with sepsis; of note pt was recently admitted for PE.  Pharmacy has been consulted for Vancocin and Zosyn dosing.  Plan: Vancomycin 1000mg  IV every 24 hours.  Goal trough 15-20 mcg/mL. Zosyn 3.375g IV q8h (4 hour infusion).  Height: 5\' 2"  (157.5 cm) Weight: 138 lb 3.7 oz (62.7 kg) IBW/kg (Calculated) : 50.1   Recent Labs Lab 03/11/16 1730  WBC 13.9*  CREATININE 1.03*    Estimated Creatinine Clearance: 32.8 mL/min (by C-G formula based on SCr of 1.03 mg/dL (H)).    No Known Allergies    Thank you for allowing pharmacy to be a part of this patient's care.  Kristen Lewis, Kristen Lewis 03/12/2016 1:32 AM

## 2016-03-12 NOTE — ED Notes (Signed)
Admitting Provider at bedside. 

## 2016-03-12 NOTE — Progress Notes (Signed)
Nutrition Brief Note  Patient identified on the Malnutrition Screening Tool (MST) Report. Patient reports some weight loss recently, 3% weight loss within 1 month is insignificant for the time frame. She has a good appetite and has been eating well since admission. Nutrition focused physical exam completed.  No muscle or subcutaneous fat depletion noticed.  Wt Readings from Last 15 Encounters:  03/12/16 134 lb 4.8 oz (60.9 kg)  02/19/16 138 lb 3.2 oz (62.7 kg)  11/05/14 145 lb (65.8 kg)  05/03/14 138 lb (62.6 kg)  11/01/13 138 lb (62.6 kg)  07/05/12 141 lb (64 kg)    Body mass index is 24.56 kg/m. Patient meets criteria for normal weight based on current BMI.   Current diet order is heart healthy, patient is consuming approximately 75% of meals at this time. Labs and medications reviewed.   No nutrition interventions warranted at this time. If nutrition issues arise, please consult RD.   Joaquin CourtsKimberly Micah Lewis, RD, LDN, CNSC Pager [phone] After Hours Pager [phone]

## 2016-03-12 NOTE — Consult Note (Signed)
Cardiology Consultation Note    Patient ID: MALYN AYTES, MRN: [id number], DOB/AGE: [birth year] 83 y.o. Admit date: 03/11/2016   Date of Consult: 03/12/2016 Primary Physician: Gaspar Garbe, MD Primary Cardiologist: Dr Jens Som (new)  Chief Complaint: Chest Pain Reason for Consultation: Chest Pain Requesting MD: Campbell Stall, MD  HPI: Kristen Lewis is a 83 y.o. female with history of HTN, systolic CHF (last EF 40-45%), hx PE 02/2016 on Eliquis who was admitted to the hospital for chest pain. She was recently admitted at [facility] from 1/8-1/1 with HCAP with left pleural effusion requiring thoracentesis and acute right upper lobe PE. In the ED, she was meeting SIRS cirteria with tachycardia 115, tachypnea 38, mild hypotension 94/66.   She has no established cardiologist on file but has an appointment with Dr. Jens Som on 03/16/16 to establish care. In November 2017 the patient had a cardiac cath for an MI in [place] which was negative and did not have any stents placed. These results are not on file. Her family history is significant for MI, father, who passed away due to this at the age of 60.    The patient currently resides at [facility], an a independent apartment. During her current presentation she is experiencing 1-2/10, L>R chest pain when she takes a deep breath. The pain does not persist, non-positional, non-exertional without radiation. She notes this pain is not persistent. She notes this pain is different then the symptoms she experienced when "I had an MI in the past". She notes those symptoms were purely respiratory in nature, with DOE predominating.   EKG with Q waves in lateral leads, not new. Tn negative x 4. CTA of the chest was negative for new findings. BNP elevated at 637 this admission. This is down from 949 from her last hospitalization. Her EF is 40-45% with akinesis of the inferolateral, inferior and inferospetal myocardium.    Past Medical History:    Diagnosis Date  . Arthritis   . Elevated cholesterol   . Hearing deficit   . Hypertension   . Vaginal pessary present       Surgical History:  Past Surgical History:  Procedure Laterality Date  . CARDIAC CATHETERIZATION    . CHOLECYSTECTOMY    . VAGINAL HYSTERECTOMY     Leiomyomata/bleeding     Home Meds: Medication Sig Start Date  apixaban (ELIQUIS) 5 MG TABS tablet Take 10mg  (two tablets) twice daily for 7 days, then take 5mg  (one tablet) twice daily. Patient taking differently: Take 5 mg by mouth 2 (two) times daily.  02/20/16  carvedilol (COREG) 3.125 MG tablet Take 3.125 mg by mouth 2 (two) times daily. 01/31/16  cholecalciferol (VITAMIN D) 1000 UNITS tablet Take 1,000 Units by mouth every morning.    furosemide (LASIX) 40 MG tablet Take 40 mg by mouth daily. 02/06/16  lisinopril (PRINIVIL,ZESTRIL) 40 MG tablet Take 40 mg by mouth daily.   Multiple Vitamins-Minerals ([employer] SPECTRAVITE ADULT 50+) TABS Take 1 tablet by mouth daily.   nitroGLYCERIN (NITRODUR - DOSED IN MG/24 HR) 0.2 mg/hr patch Place 1 patch onto the skin daily. 02/01/16  PARoxetine (PAXIL) 20 MG tablet Take 20 mg by mouth 2 (two) times daily.    polyethylene glycol (MIRALAX / GLYCOLAX) packet Take 17 g by mouth daily as needed. Patient taking differently: Take 17 g by mouth daily as needed for mild constipation.  02/20/16  potassium chloride SA (K-DUR,KLOR-CON) 20 MEQ tablet Take 20 mEq by mouth daily.  ticagrelor (BRILINTA) 90 MG TABS tablet Take 90 mg by mouth 2 (two) times daily.   atorvastatin (LIPITOR) 20 MG tablet Take 20 mg by mouth daily. 02/01/16  Calcium-Magnesium-Vitamin D (CALCIUM 500 PO) Take 1 tablet by mouth daily.     Inpatient Medications:  . apixaban  5 mg Oral BID  . aspirin  81 mg Oral Daily  . atorvastatin  20 mg Oral Daily  . carvedilol  3.125 mg Oral BID WC  . feeding supplement (ENSURE ENLIVE)  237 mL Oral BID BM  . furosemide  40 mg Oral Daily  . nitroGLYCERIN  0.2 mg Transdermal  Q24H  . PARoxetine  20 mg Oral BID     Allergies: No Known Allergies  Social History   Social History  . Marital status: Married    Spouse name: N/A  . Number of children: N/A  . Years of education: N/A   Occupational History  . Not on file.   Social History Main Topics  . Smoking status: Never Smoker  . Smokeless tobacco: Never Used  . Alcohol use 0.0 oz/week     Comment: Rare  . Drug use: No  . Sexual activity: No     Comment: 1st intercourse 42 yo-1 partner   Other Topics Concern  . Not on file   Social History Narrative  . No narrative on file     Family History  Problem Relation Age of Onset  . Cancer Mother     Intestinal/Liver  . Hypertension Mother   . Hypertension Father   . Heart attack Father   . Hypertension Brother   . Heart disease Brother      Review of Systems: General: negative for chills, fever, night sweats or weight changes.  Cardiovascular: negative for edema, orthopnea, palpitations, paroxysmal nocturnal dyspnea, shortness of breath or dyspnea on exertion Dermatological: negative for rash Respiratory: negative for cough or wheezing Urologic: negative for hematuria Abdominal: negative for nausea, vomiting, diarrhea, bright red blood per rectum, melena, or hematemesis Neurologic: negative for visual changes, syncope, or dizziness All other systems reviewed and are otherwise negative except as noted above.  Labs:  Recent Labs  03/12/16 0508  TROPONINI <0.03   Lab Results  Component Value Date   WBC 10.5 03/12/2016   HGB 9.5 (L) 03/12/2016   HCT 29.7 (L) 03/12/2016   MCV 87.9 03/12/2016   PLT 220 03/12/2016    Recent Labs Lab 03/12/16 0508  NA 135  K 3.5  CL 105  CO2 24  BUN 13  CREATININE 0.89  CALCIUM 8.5*  GLUCOSE 105*   No results found for: CHOL, HDL, LDLCALC, TRIG Lab Results  Component Value Date   DDIMER 1.83 (H) 02/17/2016    Radiology/Studies:  Dg Chest 1 View  Result Date: 02/19/2016 CLINICAL DATA:   Post left thoracentesis EXAM: CHEST 1 VIEW COMPARISON:  02/17/2016 FINDINGS: Decreased left pleural effusion following thoracentesis. No pneumothorax. Left lower lobe atelectasis has improved Mild right lower lobe airspace disease shows progression. Small right effusion. IMPRESSION: No pneumothorax post left thoracentesis. Improved aeration in the left lung base Progression of right lower lobe airspace disease. Small right effusion unchanged. Electronically Signed   By: Marlan Palau M.D.   On: 02/19/2016 15:22   Dg Chest 2 View  Result Date: 03/11/2016 CLINICAL DATA:  Pleuritic left-sided chest wall pain for 2 days. Recent thoracentesis on the left. EXAM: CHEST  2 VIEW COMPARISON:  02/19/2016 FINDINGS: Minimal curvilinear opacities in the left base probably represents  scarring or atelectasis. Similar curvilinear opacities are present to a lesser degree on the right. No confluent consolidation. No significant effusion. Normal pulmonary vasculature. Unchanged mild cardiomegaly. Hilar and mediastinal contours are unremarkable and unchanged. IMPRESSION: Mild curvilinear scarring or atelectasis in the lung bases. No significant effusion. No consolidation. Electronically Signed   By: Ellery Plunk M.D.   On: 03/11/2016 18:10   Dg Chest 2 View  Result Date: 02/17/2016 CLINICAL DATA:  Chest pain when breathing. Shortness of breath. Pneumonia for 2 days. History of myocardial infarct, bronchitis, former smoker, hypertension. EXAM: CHEST  2 VIEW COMPARISON:  None. FINDINGS: Moderate size left pleural effusion with atelectasis or consolidation in the left lung base. This may indicate pneumonia. Slight fibrosis or linear atelectasis in the right lung base. Central interstitial pattern with peribronchial thickening suggesting chronic bronchitis. No pneumothorax. Normal heart size and pulmonary vascularity. Calcified and tortuous aorta. Degenerative changes in the spine. Surgical clips in the upper abdomen.  IMPRESSION: Consolidation or atelectasis in the left lung base with moderate left pleural effusion. Changes may indicate pneumonia. Followup PA and lateral chest X-ray is recommended in 3-4 weeks following appropriate clinical therapy to ensure resolution and exclude underlying malignancy. Electronically Signed   By: Burman Nieves M.D.   On: 02/17/2016 22:27   Ct Angio Chest Pe W Or Wo Contrast  Result Date: 03/12/2016 CLINICAL DATA:  Pleuritic left-sided chest pain and worsening dyspnea on exertion, onset today. EXAM: CT ANGIOGRAPHY CHEST WITH CONTRAST TECHNIQUE: Multidetector CT imaging of the chest was performed using the standard protocol during bolus administration of intravenous contrast. Multiplanar CT image reconstructions and MIPs were obtained to evaluate the vascular anatomy. CONTRAST:  100 mL Isovue 370 intravenous COMPARISON:  02/18/2016 FINDINGS: Cardiovascular: Good opacification of the pulmonary vasculature with no evidence of pulmonary embolism. The segmental right upper lobe pulmonary embolus observed on 02/18/2016 is no longer evident. No evidence of right heart strain. Small pericardial effusion persists. The thoracic aorta exhibits moderate atherosclerotic calcification. 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 Mediastinum/Nodes: No enlarged mediastinal, hilar, or axillary lymph nodes. Thyroid gland, trachea, and esophagus demonstrate no significant findings. Lungs/Pleura: Mild atelectatic appearing linear opacities in the bases, left greater than right. Trace left pleural effusion, significantly reduced from 02/18/2016. Upper Abdomen: No acute findings. Musculoskeletal: No significant skeletal lesion. Review of the MIP images confirms the above findings. IMPRESSION: 1. Negative for acute pulmonary embolism. 2. 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 This appears to be unchanged from 02/18/2016. 3. Thoracic aortic atherosclerosis. 4. Small pericardial effusion. 5. Trace left pleural effusion and mild atelectatic appearing linear lung base opacities, reduced from 02/18/2016. Electronically Signed   By: Ellery Plunk M.D.   On: 03/12/2016 03:18   Ct Angio Chest Pe W Or Wo Contrast  Result Date: 02/18/2016 CLINICAL DATA:  Shortness of breath EXAM: CT ANGIOGRAPHY CHEST WITH CONTRAST TECHNIQUE: Multidetector CT imaging of the chest was performed using the standard protocol during bolus administration of intravenous contrast. Multiplanar CT image reconstructions and MIPs were obtained to evaluate the vascular anatomy. CONTRAST:  100 mL Isovue 370 IV COMPARISON:  Chest radiograph 02/17/2016 FINDINGS: Cardiovascular: Contrast injection is sufficient to demonstrate satisfactory opacification of the pulmonary arteries to the segmental level. There is a small filling defect within the right upper lobe posterior segmental pulmonary artery (series  5 image 85, series 9 image 62). No other pulmonary emboli are identified. The main pulmonary artery is within normal limits for size. There is no CT evidence of acute right heart strain. There is atherosclerotic calcification of the aorta. Coronary artery calcifications are also noted. There is a normal 3-vessel arch branching pattern. Heart size is normal. There is a small pericardial effusion, measuring up to 6 mm in thickness. Mediastinum/Nodes: No mediastinal, hilar or axillary lymphadenopathy. The visualized thyroid and thoracic esophageal course are unremarkable. Lungs/Pleura: There are large left and small right pleural effusions. There is moderate collapse of the left lower lobe and platelike atelectasis within the lingula. Upper Abdomen: Contrast bolus timing is not optimized for evaluation of the abdominal organs. Within this limitation, the visualized organs of the  upper abdomen are normal. Musculoskeletal: No chest wall abnormality. No acute or significant osseous findings. Review of the MIP images confirms the above findings. IMPRESSION: 1. Small right upper lobe posterior segmental artery pulmonary embolus. No evidence of right heart strain. 2. Aortic and coronary artery atherosclerosis. 3. Small pericardial effusion. 4. Large left and small right pleural effusions with associated atelectasis. These results will be called to the ordering clinician or representative by the Radiologist Assistant, and communication documented in the PACS or zVision Dashboard. Electronically Signed   By: Deatra Robinson M.D.   On: 02/18/2016 05:42   US Thoracentesis Asp Pleural Space W/img Guide  Result Date: 02/19/2016 INDICATION: Patient with history of pneumonia, PE, CHF, coronary artery disease, left pleural effusion. Request made for diagnostic and therapeutic left thoracentesis. EXAM: ULTRASOUND GUIDED DIAGNOSTIC AND THERAPEUTIC LEFT THORACENTESIS MEDICATIONS: None. COMPLICATIONS: None immediate. PROCEDURE: An ultrasound guided thoracentesis was thoroughly discussed with the patient and questions answered. The benefits, risks, alternatives and complications were also discussed. The patient understands and wishes to proceed with the procedure. Written consent was obtained. Ultrasound was performed to localize and mark an adequate pocket of fluid in the left chest. The area was then prepped and draped in the normal sterile fashion. 1% Lidocaine was used for local anesthesia. Under ultrasound guidance a Safe-T-Centesis catheter was introduced. Thoracentesis was performed. The catheter was removed and a dressing applied. FINDINGS: A total of approximately 510 cc of yellow fluid was removed. Samples were sent to the laboratory as requested by the clinical team. IMPRESSION: Successful ultrasound guided diagnostic and therapeutic left thoracentesis yielding 510 cc of pleural fluid. Read by:  Jeananne Rama, PA-C Electronically Signed   By: Gilmer Mor D.O.   On: 02/19/2016 16:37    Wt Readings from Last 3 Encounters:  03/12/16 60.9 kg (134 lb 4.8 oz)  02/19/16 62.7 kg (138 lb 3.2 oz)  11/05/14 65.8 kg (145 lb)    EKG: NSR, LAD, lateral Qs  Physical Exam: Blood pressure 139/77, pulse 98, temperature 98.4 F (36.9 C), temperature source Oral, resp. rate (!) 30, height 5\' 2"  (1.575 m), weight 60.9 kg (134 lb 4.8 oz), SpO2 98 %. Body mass index is 24.56 kg/m. General: Well developed, well nourished, in no acute distress. Head: Normocephalic, atraumatic, sclera non-icteric, no xanthomas, nares are without discharge.  Neck: Negative for carotid bruits. JVD not elevated. Lungs: Clear bilaterally to auscultation without wheezes, rales, or rhonchi. Breathing is unlabored. Heart: RRR with S1 S2. No murmurs, rubs, or gallops appreciated. Abdomen: Soft, non-tender, non-distended with normoactive bowel sounds. No hepatomegaly. No rebound/guarding. No obvious abdominal masses. Msk:  Strength and tone appear normal for age. Extremities: No clubbing or cyanosis. No edema.  Distal  pedal pulses are 2+ and equal bilaterally. Neuro: Alert and oriented X 3. No facial asymmetry. No focal deficit. Moves all extremities spontaneously. Psych:  Responds to questions appropriately with a normal affect.     Assessment and Plan   1. Chest pain-atypical for angina, more consistent with pleuritic pain.   2. History of MI- She gives a history of an MI and cath (medical Rx) in [place] in Nov 2017. She has inf/lat WMA on echo. Records from  Dr Nigel Bridgemanissovek have been requested, the pt could not remember the MD or Hospital in [place].   3. Small RUL PE 02/19/16- pt is on Eliquis  4. Lt pleural effusion- 500 cc tapped two weeks ago, no apparent recurrence by CT   5. Essential HTN-on ACE and Coreg pta.   Plan:  Negative Troponin x 3, pain does not sound cardiac. Will try and get records from her cath from Dr  Nigel Bridgemanissovek. She should keep her apt with Dr Jens Somrenshaw 03/16/16.   Corine ShelterLUKE KILROY PA-C 03/12/2016 3:00 PM  History and all data above reviewed.  Patient examined.  I agree with the findings as above.  The patient presents with chest pain.  Her pain is short mild and brought on with breathing.  She does have a recent cardiac history with cath in [place].  We are waiting for records.  She has been on Brilinta.  She has been on 81 mg ASA as well.  She was recently put on Eliquis for a PE.   The patient exam reveals COR:RRR  ,  Lungs: Decreased breath sounds at the bases  ,  Abd: Positive bowel sounds, no rebound no guarding, Ext No edema  .  All available labs, radiology testing, previous records reviewed. Agree with documented assessment and plan. Chest pain:  Atypical.  We will review the outside records.  For now stop the ASA.  She can be on DOAC and Brilinta.  We will decide on anticoagulation prior to discharge after reviewing the records.    Fayrene FearingJames Bailey Kolbe  3:24 PM  03/12/2016

## 2016-03-13 DIAGNOSIS — I2699 Other pulmonary embolism without acute cor pulmonale: Secondary | ICD-10-CM

## 2016-03-13 DIAGNOSIS — I10 Essential (primary) hypertension: Secondary | ICD-10-CM | POA: Diagnosis not present

## 2016-03-13 DIAGNOSIS — E785 Hyperlipidemia, unspecified: Secondary | ICD-10-CM

## 2016-03-13 DIAGNOSIS — R079 Chest pain, unspecified: Secondary | ICD-10-CM | POA: Diagnosis not present

## 2016-03-13 MED ORDER — ASPIRIN 81 MG PO CHEW: 81.0000 mg | CHEWABLE_TABLET | Freq: Every day | ORAL | Status: DC

## 2016-03-13 MED ORDER — IBUPROFEN 200 MG PO TABS: 200.0000 mg | ORAL_TABLET | Freq: Three times a day (TID) | ORAL | 0 refills | Status: AC

## 2016-03-13 MED ORDER — IBUPROFEN 200 MG PO TABS
200.0000 mg | ORAL_TABLET | Freq: Three times a day (TID) | ORAL | Status: DC
Start: 2016-03-13 — End: 2016-03-13
  Administered 2016-03-13 (×2): 200 mg via ORAL
  Filled 2016-03-13 (×2): qty 1

## 2016-03-13 MED ORDER — CARVEDILOL 3.125 MG PO TABS: 3.1250 mg | ORAL_TABLET | Freq: Two times a day (BID) | ORAL | 0 refills | Status: DC

## 2016-03-13 MED ORDER — APIXABAN 5 MG PO TABS: 5.0000 mg | ORAL_TABLET | Freq: Two times a day (BID) | ORAL | 0 refills | Status: DC

## 2016-03-13 MED ORDER — ASPIRIN 81 MG PO CHEW: 81.0000 mg | CHEWABLE_TABLET | Freq: Every day | ORAL | 0 refills | Status: DC

## 2016-03-13 NOTE — Progress Notes (Signed)
Progress Note  Patient Name: Kristen Lewis Date of Encounter: 03/13/2016  Primary Cardiologist: New  Dr. Antoine Poche  Subjective   No chest pain.  No SOB.    Inpatient Medications    Scheduled Meds: . apixaban  5 mg Oral BID  . atorvastatin  20 mg Oral Daily  . carvedilol  3.125 mg Oral BID WC  . clopidogrel  75 mg Oral Daily  . feeding supplement (ENSURE ENLIVE)  237 mL Oral BID BM  . furosemide  40 mg Oral Daily  . nitroGLYCERIN  0.2 mg Transdermal Q24H  . PARoxetine  20 mg Oral BID  . polyethylene glycol  17 g Oral Daily   Continuous Infusions:  PRN Meds: acetaminophen **OR** acetaminophen, albuterol, bisacodyl, ondansetron **OR** ondansetron (ZOFRAN) IV, traMADol   Vital Signs    Vitals:   03/13/16 0621 03/13/16 0627 03/13/16 0630 03/13/16 0633  BP: 117/74 119/67 117/61 (!) 77/43  Pulse:      Resp:      Temp:      TempSrc:      SpO2:      Weight:      Height:        Intake/Output Summary (Last 24 hours) at 03/13/16 0829 Last data filed at 03/13/16 1610  Gross per 24 hour  Intake              960 ml  Output             1575 ml  Net             -615 ml   Filed Weights   03/12/16 0100 03/12/16 0348 03/13/16 0522  Weight: 138 lb 3.7 oz (62.7 kg) 134 lb 4.8 oz (60.9 kg) 130 lb 9.6 oz (59.2 kg)    Telemetry    Sinus tach.  - Personally Reviewed  ECG    NA - Personally Reviewed  Physical Exam   GEN: No acute distress.   Neck: No JVD Cardiac: RRR, no murmurs, rubs, or gallops.  Respiratory: Clear to auscultation bilaterally. GI: Soft, nontender, non-distended  MS: No edema; No deformity. Neuro:  Nonfocal  Psych: Normal affect   Labs    Chemistry Recent Labs Lab 03/11/16 1730 03/12/16 0508  NA 132* 135  K 3.8 3.5  CL 98* 105  CO2 24 24  GLUCOSE 129* 105*  BUN 17 13  CREATININE 1.03* 0.89  CALCIUM 9.0 8.5*  GFRNONAA 47* 56*  GFRAA 55* >60  ANIONGAP 10 6     Hematology Recent Labs Lab 03/11/16 1730 03/12/16 0508  WBC  13.9* 10.5  RBC 3.67* 3.38*  HGB 10.5* 9.5*  HCT 32.5* 29.7*  MCV 88.6 87.9  MCH 28.6 28.1  MCHC 32.3 32.0  RDW 15.3 15.5  PLT 269 220    Cardiac Enzymes Recent Labs Lab 03/12/16 0508  TROPONINI <0.03    Recent Labs Lab 03/11/16 1742 03/11/16 2031 03/11/16 2300 03/12/16 0244  TROPIPOC 0.00 0.01 0.00 0.01     BNP Recent Labs Lab 03/12/16 0630  BNP 637.2*     DDimer No results for input(s): DDIMER in the last 168 hours.   Radiology    Dg Chest 2 View  Result Date: 03/11/2016 CLINICAL DATA:  Pleuritic left-sided chest wall pain for 2 days. Recent thoracentesis on the left. EXAM: CHEST  2 VIEW COMPARISON:  02/19/2016 FINDINGS: Minimal curvilinear opacities in the left base probably represents scarring or atelectasis. Similar curvilinear opacities are present to a lesser degree on  the right. No confluent consolidation. No significant effusion. Normal pulmonary vasculature. Unchanged mild cardiomegaly. Hilar and mediastinal contours are unremarkable and unchanged. IMPRESSION: Mild curvilinear scarring or atelectasis in the lung bases. No significant effusion. No consolidation. Electronically Signed   By: Ellery Plunkaniel R Mitchell M.D.   On: 03/11/2016 18:10   Ct Angio Chest Pe W Or Wo Contrast  Result Date: 03/12/2016 CLINICAL DATA:  Pleuritic left-sided chest pain and worsening dyspnea on exertion, onset today. EXAM: CT ANGIOGRAPHY CHEST WITH CONTRAST TECHNIQUE: Multidetector CT imaging of the chest was performed using the standard protocol during bolus administration of intravenous contrast. Multiplanar CT image reconstructions and MIPs were obtained to evaluate the vascular anatomy. CONTRAST:  100 mL Isovue 370 intravenous COMPARISON:  02/18/2016 FINDINGS: Cardiovascular: Good opacification of the pulmonary vasculature with no evidence of pulmonary embolism. The segmental right upper lobe pulmonary embolus observed on 02/18/2016 is no longer evident. No evidence of right heart strain.  Small pericardial effusion persists. The thoracic aorta exhibits moderate atherosclerotic calcification. 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 Mediastinum/Nodes: No enlarged mediastinal, hilar, or axillary lymph nodes. Thyroid gland, trachea, and esophagus demonstrate no significant findings. Lungs/Pleura: Mild atelectatic appearing linear opacities in the bases, left greater than right. Trace left pleural effusion, significantly reduced from 02/18/2016. Upper Abdomen: No acute findings. Musculoskeletal: No significant skeletal lesion. Review of the MIP images confirms the above findings. IMPRESSION: 1. Negative for acute pulmonary embolism. 2. 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 This appears to be unchanged from 02/18/2016. 3. Thoracic aortic atherosclerosis. 4. Small pericardial effusion. 5. Trace left pleural effusion and mild atelectatic appearing linear lung base opacities, reduced from 02/18/2016. Electronically Signed   By: Ellery Plunkaniel R Mitchell M.D.   On: 03/12/2016 03:18    Cardiac Studies   NA  Patient Profile     Greta DoomCatherine A Thayer is a 83 y.o. female with history of HTN, systolic CHF (last EF 40-45%), hx PE 02/2016 on Eliquis who was admitted to the hospital for chest pain. [facility] with HCAP with left pleural effusion requiring thoracentesis and acute right upper lobe PE. In the ED, she was meeting SIRS cirteria with tachycardia 115, tachypnea 38, mild hypotension 94/66.   Assessment & Plan    CHEST PAIN:  Atypical.  No elevated enzymes.    ANTICOAGULATION:   She came in on triple therapy.  This included Brilinta, Eliquis and ASA.  The risk of bleeding on this is high.  We are still waiting on records from her intervention.  However, at this point it is most prudent  to keep her on Eliquis and Plavix only.  No ASA.   We will try again today to get records.   Signed, Rollene RotundaJames Yanique Mulvihill, MD  03/13/2016, 8:29 AM

## 2016-03-13 NOTE — Progress Notes (Addendum)
Spoke with Dr. Mechele DawleyMichael Devita's office with [facility] in [place], [phone].  They stated patient had Cardiac Cath in November 2017 and they would fax over Cath report.  Colman Caterarpley, Keltie Labell Danielle

## 2016-03-13 NOTE — Discharge Summary (Signed)
Physician Discharge Summary  Kristen Lewis:[id number] DOB: September 22, 1925 DOA: 03/11/2016  PCP: Gaspar Garbe, MD  Admit date: 03/11/2016 Discharge date: 03/13/2016  Admitted From: Home  Disposition:  Home   Recommendations for Outpatient Follow-up:  1. Follow up with PCP in 1-week. 2. Holding lisinopril due to risk of hypotension.    Home Health: Home  Equipment/Devices:NA  Discharge Condition: Stable  CODE STATUS:Full  Diet recommendation: Heart Healthy  Brief/Interim Summary: This is a 83 yo female who presents with the chief complain of chest pain. Pain had been constant, mainly pleuritic in nature, 1- 2 out of 10 in intensity, she had a recent hospitalization for health care associated pneumonia with pleural effusion which she was intervened with thoracentesis. History of pulmonary embolism on anticoagulation with apixaban. On initial physical examination her vital signs were stable, her lungs were clear to auscultation bilaterally, heart sounds S1 and S2 present rhythmic. Lower extremities no edema. Sodium 135, potassium 3.5, chloride 105, bicarbonate 24, BUN 13, creatinine 0.89, BNP 637, troponins were negative, white count 10.5, hemoglobin 9.5, hematocrit 39.7, platelets 220. Her urinalysis was negative. CT chest was negative for pulmonary embolism. 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 This x-ray was negative for infiltrates. EKG with sinus rhythm, Q waves in 1 and aVL.  The patient was admitted to the hospital with the working diagnosis of atypical chest pain.   1. Atypical chest pain. Patient ruled out for acute coronary syndrome, patient was seen by cardiology. Records from recent cardiac catheterization, were obtained by cardiology with results as follows:  There was an unsuccessful attempt at RCA intervention. She has residual CAD including 80% mid LAD, 70% CFX, 90% OM1, 90% Dx, with an EF of 50%.  The RCA was 100% occluded proximally with Lt-Rt collaterals. Recommendation to continue aspirin and apixaban, brillinta  was discontinued. Her pain was attributed to be pleuritic in nature, she responded to ibuprofen well, will recommend to continue low-dose ibuprofen 3 times daily for 2 days.  2. Hypertension. Remained well-controlled, patient was continued on her antihypertensive agents, with coronary, ACE inhibitor will be held due to risk of hypotension.  3. Chronic Diastolic heart failure. It remained stable, patient was continued on furosemide.  4. Pulmonary embolism. Patient will continue anticoagulation with apixaban.   5. Depression. Continue paroxetine.  6. Coronary artery disease. Continue statin therapy, antiplatelet therapy, beta blockade, holding ACE inhibitor, due to risk of hypotension. Patient will follow-up with cardiology as an outpatient.   Discharge Diagnoses:  Principal Problem:   Chest pain Active Problems:   CHF (congestive heart failure) (HCC)   Essential hypertension   Hyperlipidemia   Pulmonary embolus (HCC)   Hyponatremia   Leukocytosis   SIRS (systemic inflammatory response syndrome) (HCC)    Discharge Instructions   Allergies as of 03/13/2016   No Known Allergies     Medication List    STOP taking these medications   lisinopril 40 MG tablet Commonly known as:  PRINIVIL,ZESTRIL   ticagrelor 90 MG Tabs tablet Commonly known as:  BRILINTA     TAKE these medications   apixaban 5 MG Tabs tablet Commonly known as:  ELIQUIS Take 1 tablet (5 mg total) by mouth 2 (two) times daily.   aspirin 81 MG chewable tablet Chew 1 tablet (81 mg total) by mouth daily. Start taking on:  03/14/2016   atorvastatin 20 MG tablet Commonly known as:  LIPITOR Take 20 mg by  mouth daily.   CALCIUM 500 PO Take 1 tablet by mouth daily.   carvedilol 3.125 MG tablet Commonly known as:  COREG Take 1 tablet (3.125 mg total) by mouth 2 (two) times daily with a  meal. What changed:  when to take this   cholecalciferol 1000 units tablet Commonly known as:  VITAMIN D Take 1,000 Units by mouth every morning.   [employer] SPECTRAVITE ADULT 50+ Tabs Take 1 tablet by mouth daily.   furosemide 40 MG tablet Commonly known as:  LASIX Take 40 mg by mouth daily.   ibuprofen 200 MG tablet Commonly known as:  ADVIL,MOTRIN Take 1 tablet (200 mg total) by mouth 3 (three) times daily. Take only for 2 days.   nitroGLYCERIN 0.2 mg/hr patch Commonly known as:  NITRODUR - Dosed in mg/24 hr Place 1 patch onto the skin daily.   PARoxetine 20 MG tablet Commonly known as:  PAXIL Take 20 mg by mouth 2 (two) times daily.   polyethylene glycol packet Commonly known as:  MIRALAX / GLYCOLAX Take 17 g by mouth daily as needed. What changed:  reasons to take this   potassium chloride SA 20 MEQ tablet Commonly known as:  K-DUR,KLOR-CON Take 20 mEq by mouth daily.       No Known Allergies   Consultations:  Cardiology    Procedures/Studies: Dg Chest 1 View  Result Date: 02/19/2016 CLINICAL DATA:  Post left thoracentesis EXAM: CHEST 1 VIEW COMPARISON:  02/17/2016 FINDINGS: Decreased left pleural effusion following thoracentesis. No pneumothorax. Left lower lobe atelectasis has improved Mild right lower lobe airspace disease shows progression. Small right effusion. IMPRESSION: No pneumothorax post left thoracentesis. Improved aeration in the left lung base Progression of right lower lobe airspace disease. Small right effusion unchanged. Electronically Signed   By: Marlan Palau M.D.   On: 02/19/2016 15:22   Dg Chest 2 View  Result Date: 03/11/2016 CLINICAL DATA:  Pleuritic left-sided chest wall pain for 2 days. Recent thoracentesis on the left. EXAM: CHEST  2 VIEW COMPARISON:  02/19/2016 FINDINGS: Minimal curvilinear opacities in the left base probably represents scarring or atelectasis. Similar curvilinear opacities are present to a lesser degree on the right.  No confluent consolidation. No significant effusion. Normal pulmonary vasculature. Unchanged mild cardiomegaly. Hilar and mediastinal contours are unremarkable and unchanged. IMPRESSION: Mild curvilinear scarring or atelectasis in the lung bases. No significant effusion. No consolidation. Electronically Signed   By: Ellery Plunk M.D.   On: 03/11/2016 18:10   Dg Chest 2 View  Result Date: 02/17/2016 CLINICAL DATA:  Chest pain when breathing. Shortness of breath. Pneumonia for 2 days. History of myocardial infarct, bronchitis, former smoker, hypertension. EXAM: CHEST  2 VIEW COMPARISON:  None. FINDINGS: Moderate size left pleural effusion with atelectasis or consolidation in the left lung base. This may indicate pneumonia. Slight fibrosis or linear atelectasis in the right lung base. Central interstitial pattern with peribronchial thickening suggesting chronic bronchitis. No pneumothorax. Normal heart size and pulmonary vascularity. Calcified and tortuous aorta. Degenerative changes in the spine. Surgical clips in the upper abdomen. IMPRESSION: Consolidation or atelectasis in the left lung base with moderate left pleural effusion. Changes may indicate pneumonia. Followup PA and lateral chest X-ray is recommended in 3-4 weeks following appropriate clinical therapy to ensure resolution and exclude underlying malignancy. Electronically Signed   By: Burman Nieves M.D.   On: 02/17/2016 22:27   Ct Angio Chest Pe W Or Wo Contrast  Result Date: 03/12/2016 CLINICAL DATA:  Pleuritic left-sided chest  pain and worsening dyspnea on exertion, onset today. EXAM: CT ANGIOGRAPHY CHEST WITH CONTRAST TECHNIQUE: Multidetector CT imaging of the chest was performed using the standard protocol during bolus administration of intravenous contrast. Multiplanar CT image reconstructions and MIPs were obtained to evaluate the vascular anatomy. CONTRAST:  100 mL Isovue 370 intravenous COMPARISON:  02/18/2016 FINDINGS: Cardiovascular:  Good opacification of the pulmonary vasculature with no evidence of pulmonary embolism. The segmental right upper lobe pulmonary embolus observed on 02/18/2016 is no longer evident. No evidence of right heart strain. Small pericardial effusion persists. The thoracic aorta exhibits moderate atherosclerotic calcification. 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 Mediastinum/Nodes: No enlarged mediastinal, hilar, or axillary lymph nodes. Thyroid gland, trachea, and esophagus demonstrate no significant findings. Lungs/Pleura: Mild atelectatic appearing linear opacities in the bases, left greater than right. Trace left pleural effusion, significantly reduced from 02/18/2016. Upper Abdomen: No acute findings. Musculoskeletal: No significant skeletal lesion. Review of the MIP images confirms the above findings. IMPRESSION: 1. Negative for acute pulmonary embolism. 2. 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 This appears to be unchanged from 02/18/2016. 3. Thoracic aortic atherosclerosis. 4. Small pericardial effusion. 5. Trace left pleural effusion and mild atelectatic appearing linear lung base opacities, reduced from 02/18/2016. Electronically Signed   By: Ellery Plunk M.D.   On: 03/12/2016 03:18   Ct Angio Chest Pe W Or Wo Contrast  Result Date: 02/18/2016 CLINICAL DATA:  Shortness of breath EXAM: CT ANGIOGRAPHY CHEST WITH CONTRAST TECHNIQUE: Multidetector CT imaging of the chest was performed using the standard protocol during bolus administration of intravenous contrast. Multiplanar CT image reconstructions and MIPs were obtained to evaluate the vascular anatomy. CONTRAST:  100 mL Isovue 370 IV COMPARISON:  Chest radiograph 02/17/2016 FINDINGS: Cardiovascular: Contrast injection is sufficient to demonstrate satisfactory  opacification of the pulmonary arteries to the segmental level. There is a small filling defect within the right upper lobe posterior segmental pulmonary artery (series 5 image 85, series 9 image 62). No other pulmonary emboli are identified. The main pulmonary artery is within normal limits for size. There is no CT evidence of acute right heart strain. There is atherosclerotic calcification of the aorta. Coronary artery calcifications are also noted. There is a normal 3-vessel arch branching pattern. Heart size is normal. There is a small pericardial effusion, measuring up to 6 mm in thickness. Mediastinum/Nodes: No mediastinal, hilar or axillary lymphadenopathy. The visualized thyroid and thoracic esophageal course are unremarkable. Lungs/Pleura: There are large left and small right pleural effusions. There is moderate collapse of the left lower lobe and platelike atelectasis within the lingula. Upper Abdomen: Contrast bolus timing is not optimized for evaluation of the abdominal organs. Within this limitation, the visualized organs of the upper abdomen are normal. Musculoskeletal: No chest wall abnormality. No acute or significant osseous findings. Review of the MIP images confirms the above findings. IMPRESSION: 1. Small right upper lobe posterior segmental artery pulmonary embolus. No evidence of right heart strain. 2. Aortic and coronary artery atherosclerosis. 3. Small pericardial effusion. 4. Large left and small right pleural effusions with associated atelectasis. These results will be called to the ordering clinician or representative by the Radiologist Assistant, and communication documented in the PACS or zVision Dashboard. Electronically Signed   By: Deatra Robinson M.D.   On: 02/18/2016 05:42   US Thoracentesis Asp  Pleural Space W/img Guide  Result Date: 02/19/2016 INDICATION: Patient with history of pneumonia, PE, CHF, coronary artery disease, left pleural effusion. Request made for diagnostic and  therapeutic left thoracentesis. EXAM: ULTRASOUND GUIDED DIAGNOSTIC AND THERAPEUTIC LEFT THORACENTESIS MEDICATIONS: None. COMPLICATIONS: None immediate. PROCEDURE: An ultrasound guided thoracentesis was thoroughly discussed with the patient and questions answered. The benefits, risks, alternatives and complications were also discussed. The patient understands and wishes to proceed with the procedure. Written consent was obtained. Ultrasound was performed to localize and mark an adequate pocket of fluid in the left chest. The area was then prepped and draped in the normal sterile fashion. 1% Lidocaine was used for local anesthesia. Under ultrasound guidance a Safe-T-Centesis catheter was introduced. Thoracentesis was performed. The catheter was removed and a dressing applied. FINDINGS: A total of approximately 510 cc of yellow fluid was removed. Samples were sent to the laboratory as requested by the clinical team. IMPRESSION: Successful ultrasound guided diagnostic and therapeutic left thoracentesis yielding 510 cc of pleural fluid. Read by: Jeananne Rama, PA-C Electronically Signed   By: Gilmer Mor D.O.   On: 02/19/2016 16:37    (Echo, Carotid, EGD, Colonoscopy, ERCP)    Subjective: Patient's pain has improved, no nausea or vomiting. Tolerating po well.   Discharge Exam: Vitals:   03/13/16 1443 03/13/16 1444  BP: (!) 100/45 (!) 96/54  Pulse: 87   Resp: 18   Temp: 98.7 F (37.1 C)    Vitals:   03/13/16 0800 03/13/16 1030 03/13/16 1443 03/13/16 1444  BP: (!) 81/57 117/75 (!) 100/45 (!) 96/54  Pulse:   87   Resp:   18   Temp:   98.7 F (37.1 C)   TempSrc:   Oral   SpO2:   96%   Weight:      Height:        General: Pt is alert, awake, not in acute distress Cardiovascular: RRR, S1/S2 +, no rubs, no gallops Respiratory: CTA bilaterally, no wheezing, no rhonchi Abdominal: Soft, NT, ND, bowel sounds + Extremities: no edema, no cyanosis    The results of significant diagnostics from  this hospitalization (including imaging, microbiology, ancillary and laboratory) are listed below for reference.     Microbiology: Recent Results (from the past 240 hour(s))  Culture, blood (x 2)     Status: None (Preliminary result)   Collection Time: 03/12/16  2:15 AM  Result Value Ref Range Status   Specimen Description BLOOD RIGHT FOREARM  Final   Special Requests BOTTLES DRAWN AEROBIC AND ANAEROBIC  Final   Culture NO GROWTH 1 DAY  Final   Report Status PENDING  Incomplete  Culture, blood (x 2)     Status: None (Preliminary result)   Collection Time: 03/12/16  2:25 AM  Result Value Ref Range Status   Specimen Description BLOOD RIGHT WRIST  Final   Special Requests BOTTLES DRAWN AEROBIC AND ANAEROBIC  Final   Culture NO GROWTH 1 DAY  Final   Report Status PENDING  Incomplete  Respiratory Panel by PCR     Status: None   Collection Time: 03/12/16  1:05 PM  Result Value Ref Range Status   Adenovirus NOT DETECTED NOT DETECTED Final   Coronavirus 229E NOT DETECTED NOT DETECTED Final   Coronavirus HKU1 NOT DETECTED NOT DETECTED Final   Coronavirus NL63 NOT DETECTED NOT DETECTED Final   Coronavirus OC43 NOT DETECTED NOT DETECTED Final   Metapneumovirus NOT DETECTED NOT DETECTED Final   Rhinovirus / Enterovirus NOT  DETECTED NOT DETECTED Final   Influenza A NOT DETECTED NOT DETECTED Final   Influenza B NOT DETECTED NOT DETECTED Final   Parainfluenza Virus 1 NOT DETECTED NOT DETECTED Final   Parainfluenza Virus 2 NOT DETECTED NOT DETECTED Final   Parainfluenza Virus 3 NOT DETECTED NOT DETECTED Final   Parainfluenza Virus 4 NOT DETECTED NOT DETECTED Final   Respiratory Syncytial Virus NOT DETECTED NOT DETECTED Final   Bordetella pertussis NOT DETECTED NOT DETECTED Final   Chlamydophila pneumoniae NOT DETECTED NOT DETECTED Final   Mycoplasma pneumoniae NOT DETECTED NOT DETECTED Final     Labs: BNP (last 3 results)  Recent Labs  02/17/16 2256 03/12/16 0630  BNP 949.0*  637.2*   Basic Metabolic Panel:  Recent Labs Lab 03/11/16 1730 03/12/16 0508  NA 132* 135  K 3.8 3.5  CL 98* 105  CO2 24 24  GLUCOSE 129* 105*  BUN 17 13  CREATININE 1.03* 0.89  CALCIUM 9.0 8.5*   Liver Function Tests: No results for input(s): AST, ALT, ALKPHOS, BILITOT, PROT, ALBUMIN in the last 168 hours. No results for input(s): LIPASE, AMYLASE in the last 168 hours. No results for input(s): AMMONIA in the last 168 hours. CBC:  Recent Labs Lab 03/11/16 1730 03/12/16 0508  WBC 13.9* 10.5  HGB 10.5* 9.5*  HCT 32.5* 29.7*  MCV 88.6 87.9  PLT 269 220   Cardiac Enzymes:  Recent Labs Lab 03/12/16 0508  TROPONINI <0.03   BNP: Invalid input(s): POCBNP CBG: No results for input(s): GLUCAP in the last 168 hours. D-Dimer No results for input(s): DDIMER in the last 72 hours. Hgb A1c No results for input(s): HGBA1C in the last 72 hours. Lipid Profile No results for input(s): CHOL, HDL, LDLCALC, TRIG, CHOLHDL, LDLDIRECT in the last 72 hours. Thyroid function studies  Recent Labs  03/12/16 0508  TSH 4.051   Anemia work up No results for input(s): VITAMINB12, FOLATE, FERRITIN, TIBC, IRON, RETICCTPCT in the last 72 hours. Urinalysis    Component Value Date/Time   COLORURINE YELLOW 03/12/2016 1924   APPEARANCEUR HAZY (A) 03/12/2016 1924   LABSPEC 1.019 03/12/2016 1924   PHURINE 6.0 03/12/2016 1924   GLUCOSEU 50 (A) 03/12/2016 1924   HGBUR MODERATE (A) 03/12/2016 1924   BILIRUBINUR NEGATIVE 03/12/2016 1924   KETONESUR NEGATIVE 03/12/2016 1924   PROTEINUR NEGATIVE 03/12/2016 1924   UROBILINOGEN 0.2 11/01/2013 1633   NITRITE NEGATIVE 03/12/2016 1924   LEUKOCYTESUR NEGATIVE 03/12/2016 1924   Sepsis Labs Invalid input(s): PROCALCITONIN,  WBC,  LACTICIDVEN Microbiology Recent Results (from the past 240 hour(s))  Culture, blood (x 2)     Status: None (Preliminary result)   Collection Time: 03/12/16  2:15 AM  Result Value Ref Range Status   Specimen  Description BLOOD RIGHT FOREARM  Final   Special Requests BOTTLES DRAWN AEROBIC AND ANAEROBIC  Final   Culture NO GROWTH 1 DAY  Final   Report Status PENDING  Incomplete  Culture, blood (x 2)     Status: None (Preliminary result)   Collection Time: 03/12/16  2:25 AM  Result Value Ref Range Status   Specimen Description BLOOD RIGHT WRIST  Final   Special Requests BOTTLES DRAWN AEROBIC AND ANAEROBIC  Final   Culture NO GROWTH 1 DAY  Final   Report Status PENDING  Incomplete  Respiratory Panel by PCR     Status: None   Collection Time: 03/12/16  1:05 PM  Result Value Ref Range Status   Adenovirus NOT DETECTED NOT DETECTED  Final   Coronavirus 229E NOT DETECTED NOT DETECTED Final   Coronavirus HKU1 NOT DETECTED NOT DETECTED Final   Coronavirus NL63 NOT DETECTED NOT DETECTED Final   Coronavirus OC43 NOT DETECTED NOT DETECTED Final   Metapneumovirus NOT DETECTED NOT DETECTED Final   Rhinovirus / Enterovirus NOT DETECTED NOT DETECTED Final   Influenza A NOT DETECTED NOT DETECTED Final   Influenza B NOT DETECTED NOT DETECTED Final   Parainfluenza Virus 1 NOT DETECTED NOT DETECTED Final   Parainfluenza Virus 2 NOT DETECTED NOT DETECTED Final   Parainfluenza Virus 3 NOT DETECTED NOT DETECTED Final   Parainfluenza Virus 4 NOT DETECTED NOT DETECTED Final   Respiratory Syncytial Virus NOT DETECTED NOT DETECTED Final   Bordetella pertussis NOT DETECTED NOT DETECTED Final   Chlamydophila pneumoniae NOT DETECTED NOT DETECTED Final   Mycoplasma pneumoniae NOT DETECTED NOT DETECTED Final     Time coordinating discharge: 45  minutes  SIGNED:   Coralie KeensMauricio Daniel Nijel Flink, MD  [facility] 03/13/2016, 4:41 PM Pager   If 7PM-7AM, please contact night-coverage [email or web address] Password [email or web address]

## 2016-03-13 NOTE — Evaluation (Signed)
Occupational Therapy Evaluation and Discharge Patient Details Name: Greta DoomCatherine A Mervin MRN: [id number] DOB: [birth year] Today's Date: 03/13/2016    History of Present Illness Patient is a 83 y/o female with hx of PE, CAD, CHF, HTN, MI presents with chest pain. Recently hospitalized 1/8-1/11 due to HCAP and PE with left pleural effusion s/p thoracentesis. EKG- ST elevation. In ED, meeting SIRS cirteria with tachycardia 115, tachypnea 38, mild hypotension 94/66.    Clinical Impression   Pt reports she was independent with ADL PTA. Currently pt overall supervision for ADL and functional mobility without c/o of dizziness throughout. Pt planning to d/c home to ILF with intermittent supervision from staff as needed. No further acute OT needs identified; signing off at this time. Please re-consult if needs change. Thank you for this referral.    Follow Up Recommendations  No OT follow up;Supervision - Intermittent    Equipment Recommendations  None recommended by OT    Recommendations for Other Services       Precautions / Restrictions Precautions Precautions: Fall Restrictions Weight Bearing Restrictions: No      Mobility Bed Mobility Overal bed mobility: Modified Independent             General bed mobility comments: HOB elevated.   Transfers Overall transfer level: Needs assistance Equipment used: None Transfers: Sit to/from Stand Sit to Stand: Supervision         General transfer comment: Supervision for safety. No c/o dizziness    Balance Overall balance assessment: Needs assistance Sitting-balance support: Feet supported;No upper extremity supported Sitting balance-Leahy Scale: Good     Standing balance support: No upper extremity supported;During functional activity Standing balance-Leahy Scale: Good                              ADL Overall ADL's : Needs assistance/impaired Eating/Feeding: Independent;Sitting   Grooming:  Supervision/safety;Standing;Wash/dry hands;Wash/dry face   Upper Body Bathing: Set up;Sitting   Lower Body Bathing: Supervison/ safety;Sit to/from stand   Upper Body Dressing : Set up;Sitting   Lower Body Dressing: Supervision/safety;Sit to/from stand   Toilet Transfer: Supervision/safety;Ambulation;Regular Toilet   Toileting- ArchitectClothing Manipulation and Hygiene: Supervision/safety;Sit to/from stand       Functional mobility during ADLs: Supervision/safety General ADL Comments: Educated pt on home safety and fall prevention, she verbalized understanding     Vision Vision Assessment?: No apparent visual deficits   Perception     Praxis      Pertinent Vitals/Pain Pain Assessment: No/denies pain     Hand Dominance     Extremity/Trunk Assessment Upper Extremity Assessment Upper Extremity Assessment: Overall WFL for tasks assessed   Lower Extremity Assessment Lower Extremity Assessment: Defer to PT evaluation   Cervical / Trunk Assessment Cervical / Trunk Assessment: Normal   Communication Communication Communication: No difficulties   Cognition Arousal/Alertness: Awake/alert Behavior During Therapy: WFL for tasks assessed/performed Overall Cognitive Status: Within Functional Limits for tasks assessed                     General Comments       Exercises       Shoulder Instructions      Home Living Family/patient expects to be discharged to:: Private residence [facility] Living Arrangements: Alone   Type of Home: Independent living facility Home Access: Level entry     Home Layout: One level     Bathroom Shower/Tub: Arts development officerWalk-in shower   Bathroom  Toilet: Standard     Home Equipment: Walker - 2 wheels;Walker - 4 wheels;Shower seat - built in;Grab bars - tub/shower          Prior Functioning/Environment Level of Independence: Independent with assistive device(s)        Comments: uses rollator to walk to dining room, walks with  no AD in her apartment,  independent ADLs, goes to exercise classes regularly. Recently has reported needing meals delivered to her room.        OT Problem List:     OT Treatment/Interventions:      OT Goals(Current goals can be found in the care plan section) Acute Rehab OT Goals Patient Stated Goal: return to ILF OT Goal Formulation: All assessment and education complete, DC therapy  OT Frequency:     Barriers to D/C:            Co-evaluation              End of Session    Activity Tolerance: Patient tolerated treatment well Patient left: in bed;with call bell/phone within reach   Time: 1359-1410 OT Time Calculation (min): 11 min Charges:  OT General Charges $OT Visit: 1 Procedure OT Evaluation $OT Eval Moderate Complexity: 1 Procedure G-Codes: OT G-codes **NOT FOR INPATIENT CLASS** Functional Assessment Tool Used: Clinical judgement Functional Limitation: Self care Self Care Current Status (Z6109): At least 1 percent but less than 20 percent impaired, limited or restricted Self Care Goal Status (U0454): At least 1 percent but less than 20 percent impaired, limited or restricted Self Care Discharge Status [phone]): At least 1 percent but less than 20 percent impaired, limited or restricted   Gaye Alken M.S., OTR/L Pager: [phone]  03/13/2016, 2:14 PM

## 2016-03-13 NOTE — Evaluation (Signed)
Physical Therapy Evaluation Patient Details Name: Kristen Lewis MRN: [id number] DOB: February 17, 1925 Today's Date: 03/13/2016   History of Present Illness  Patient is a 83 y/o female with hx of PE, CAD, CHF, HTN, MI presents with chest pain. Recently hospitalized 1/8-1/11 due to HCAP and PE with left pleural effusion s/p thoracentesis. EKG- ST elevation. In ED, meeting SIRS cirteria with tachycardia 115, tachypnea 38, mild hypotension 94/66.   Clinical Impression  Patient presents with orthostatic hypotension limiting mobility assessment. See below. Supine BP 122/73 HR 80 bpm Sitting BP 109/80 HR 120 bpm Standing BP 81/57  HR 112 bpm Sitting BP post mobility 114/69  HR 121 bpm Pt not symptomatic during testing however was earlier in AM with standing. Pt independent PTA from Independent living facility but reports having some difficulty since last admission in January. Ambulation distance limited to above vitals. Will follow acutely to maximize independence and mobility and improve activity tolerance once BP stabilizes.    Follow Up Recommendations Home health PT;Supervision for mobility/OOB    Equipment Recommendations  None recommended by PT    Recommendations for Other Services OT consult     Precautions / Restrictions Precautions Precautions: Fall Restrictions Weight Bearing Restrictions: No      Mobility  Bed Mobility Overal bed mobility: Modified Independent             General bed mobility comments: HOB elevated.   Transfers Overall transfer level: Needs assistance Equipment used: Rolling walker (2 wheeled) Transfers: Sit to/from Stand Sit to Stand: Supervision         General transfer comment: Supervision to stand from EOB due to dizziness. Transferred to chair post ambulation.  Ambulation/Gait Ambulation/Gait assistance: Min guard Ambulation Distance (Feet): 15 Feet Assistive device: Rolling walker (2 wheeled) Gait Pattern/deviations: Step-through  pattern;Decreased stride length;Trunk flexed Gait velocity: decreased   General Gait Details: Slow, mostly steady gait for within room ambulation; HR up to 121 bpm. no dizziness.   Stairs            Wheelchair Mobility    Modified Rankin (Stroke Patients Only)       Balance Overall balance assessment: Needs assistance Sitting-balance support: Feet supported;No upper extremity supported Sitting balance-Leahy Scale: Good     Standing balance support: During functional activity;Single extremity supported Standing balance-Leahy Scale: Fair Standing balance comment: Requires UE support in standing.                             Pertinent Vitals/Pain Pain Assessment: No/denies pain    Home Living Family/patient expects to be discharged to:: Other (Comment) ([facility]) Living Arrangements: Alone   Type of Home: Independent living facility Home Access: Level entry     Home Layout: One level Home Equipment: Environmental consultant - 2 wheels;Walker - 4 wheels;Shower seat - built in Additional Comments: lives at [facility] ILF, doesn't drive    Prior Function Level of Independence: Independent with assistive device(s)         Comments: uses rollator to walk to dining room, walks with no AD in her apartment,  independent ADLs, goes to exercise classes regularly. Recently has reported needing meals delivered to her room.     Hand Dominance        Extremity/Trunk Assessment   Upper Extremity Assessment Upper Extremity Assessment: Defer to OT evaluation    Lower Extremity Assessment Lower Extremity Assessment: Generalized weakness    Cervical / Trunk  Assessment Cervical / Trunk Assessment: Normal  Communication   Communication: No difficulties  Cognition Arousal/Alertness: Awake/alert Behavior During Therapy: WFL for tasks assessed/performed Overall Cognitive Status: Within Functional Limits for tasks assessed                       General Comments General comments (skin integrity, edema, etc.): Orthostatic vitals position. See Impression for details.    Exercises     Assessment/Plan    PT Assessment Patient needs continued PT services  PT Problem List Decreased activity tolerance;Decreased balance;Decreased mobility;Cardiopulmonary status limiting activity          PT Treatment Interventions Gait training;Therapeutic activities;Therapeutic exercise;Patient/family education;Balance training    PT Goals (Current goals can be found in the Care Plan section)  Acute Rehab PT Goals Patient Stated Goal: return to ILF PT Goal Formulation: With patient Time For Goal Achievement: 03/27/16 Potential to Achieve Goals: Good    Frequency Min 3X/week   Barriers to discharge Decreased caregiver support      Co-evaluation               End of Session Equipment Utilized During Treatment: Gait belt Activity Tolerance: Treatment limited secondary to medical complications (Comment) (orthostatic hypotension) Patient left: in chair;with call bell/phone within reach Nurse Communication: Mobility status    Functional Assessment Tool Used: clinical judgement Functional Limitation: Mobility: Walking and moving around Mobility: Walking and Moving Around Current Status (U9811(G8978): At least 1 percent but less than 20 percent impaired, limited or restricted Mobility: Walking and Moving Around Goal Status [phone](G8979): At least 1 percent but less than 20 percent impaired, limited or restricted    Time: 0753-0808 PT Time Calculation (min) (ACUTE ONLY): 15 min   Charges:   PT Evaluation $PT Eval Low Complexity: 1 Procedure     PT G Codes:   PT G-Codes **NOT FOR INPATIENT CLASS** Functional Assessment Tool Used: clinical judgement Functional Limitation: Mobility: Walking and moving around Mobility: Walking and Moving Around Current Status (G9562(G8978): At least 1 percent but less than 20 percent impaired, limited or  restricted Mobility: Walking and Moving Around Goal Status [phone](G8979): At least 1 percent but less than 20 percent impaired, limited or restricted    Dorthea Maina A Catalia Massett 03/13/2016, 8:16 AM Mylo RedShauna Alantis [place]une, PT, DPT [phone]

## 2016-03-13 NOTE — Progress Notes (Signed)
Pt being discharged home via wheelchair with family. Pt alert and oriented x4. VSS. Pt c/o no pain at this time. No signs of respiratory distress. Education complete and care plans resolved. IV removed with catheter intact and pt tolerated well. No further issues at this time. Pt to follow up with PCP. Mena Simonis R, RN 

## 2016-03-13 NOTE — Care Management Note (Signed)
Case Management Note  Patient Details  Name: Kristen DoomCatherine A Verga MRN: [id number] Date of Birth: [birth year]  Subjective/Objective:   Pt presented for chest pain. [facility]. Plan will be to return to IDL Facility once stable.  [facility]. Pt is refusing HH Services at this time.                   Action/Plan: No further needs from CM at this time.   Expected Discharge Date:                  Expected Discharge Plan:  Home/Self Care  In-House Referral:  NA  Discharge planning Services  CM Consult  Post Acute Care Choice:  NA Choice offered to:  NA  DME Arranged:  N/A DME Agency:  NA  HH Arranged:  Patient Refused, PT HH Agency:  NA  Status of Service:  Completed, signed off  If discussed at Long Length of Stay Meetings, dates discussed:    Additional Comments:  Gala LewandowskyGraves-Bigelow, Virtie Bungert Kaye, RN 03/13/2016, 12:23 PM

## 2016-03-13 NOTE — Care Management Obs Status (Signed)
MEDICARE OBSERVATION STATUS NOTIFICATION   Patient Details  Name: Kristen Lewis MRN: [id number] Date of Birth: [birth year]   Medicare Observation Status Notification Given:  Yes    Gala LewandowskyGraves-Bigelow, Lynnmarie Lovett Kaye, RN 03/13/2016, 12:16 PM

## 2016-03-13 NOTE — Progress Notes (Addendum)
Cath report from [facility] obtained-  The pt was cathed on 01/08/16 in setting of an inferior STEMI. There was an unsuccessful attempt at RCA intervention. She has residual CAD including 80% mid LAD, 70% CFX, 90% OM1, 90% Dx, with an EF of 50%. The RCA was 100% occluded proximally with Lt-Rt collaterals. Copies of this cath report are in the pt's hard chart and a copy has been faxed to our office.   Since she did not get a stent will stop Brilinta/Plavix and continue ASA 81 mg and Eliquis.  Note: after discussion pt will follow up with Dr Antoine PocheHochrein after discharge, not Dr Jens Somrenshaw.  Corine ShelterLUKE Sky Primo PA-C 03/13/2016 2:13 PM

## 2016-03-16 ENCOUNTER — Ambulatory Visit: Payer: Medicare Other | Admitting: Cardiology

## 2016-03-17 LAB — CULTURE, BLOOD (ROUTINE X 2)
CULTURE: NO GROWTH
Culture: NO GROWTH

## 2016-03-19 ENCOUNTER — Ambulatory Visit (INDEPENDENT_AMBULATORY_CARE_PROVIDER_SITE_OTHER): Payer: Medicare Other | Admitting: Gynecology

## 2016-03-19 ENCOUNTER — Encounter: Payer: Self-pay | Admitting: Gynecology

## 2016-03-19 VITALS — BP 120/70

## 2016-03-19 DIAGNOSIS — N993 Prolapse of vaginal vault after hysterectomy: Secondary | ICD-10-CM | POA: Diagnosis not present

## 2016-03-19 NOTE — Patient Instructions (Signed)
Follow up in 6 months for pessary check

## 2016-03-19 NOTE — Progress Notes (Signed)
    Kristen Lewis 06-04-1925 [id number]        83 y.o.  [id number] presents for pessary check with history of vaginal vault prolapse after hysterectomy. Using Gellhorn pessary with good results.  Past medical history,surgical history, problem list, medications, allergies, family history and social history were all reviewed and documented in the EPIC chart.  Directed ROS with pertinent positives and negatives documented in the history of present illness/assessment and plan.  Exam: Kristen Lewis assistant Vitals:   03/19/16 0922  BP: 120/70   General appearance:  Normal Abdomen soft nontender without masses guarding rebound Pelvic external BUS vagina with atrophic changes. Gellhorn pessary was removed, cleansed and replaced without difficulty. Vaginal mucosa without evidence of irritation or erosion. Evidence of her prolapse and rectocele is noted. Bimanual exam without masses or tenderness.  Assessment/Plan:  83 y.o. [id number] with vaginal vault prolapse after hysterectomy doing well with Gellhorn pessary. Will follow up in 6 months for pessary recheck.    Dara LordsFONTAINE,[name] P MD, 9:39 AM 03/19/2016

## 2016-03-27 ENCOUNTER — Encounter: Payer: Self-pay | Admitting: Cardiology

## 2016-03-31 ENCOUNTER — Encounter: Payer: Self-pay | Admitting: Cardiology

## 2016-03-31 ENCOUNTER — Ambulatory Visit (INDEPENDENT_AMBULATORY_CARE_PROVIDER_SITE_OTHER): Payer: Medicare Other | Admitting: Cardiology

## 2016-03-31 DIAGNOSIS — J189 Pneumonia, unspecified organism: Secondary | ICD-10-CM

## 2016-03-31 DIAGNOSIS — I255 Ischemic cardiomyopathy: Secondary | ICD-10-CM | POA: Insufficient documentation

## 2016-03-31 DIAGNOSIS — I2609 Other pulmonary embolism with acute cor pulmonale: Secondary | ICD-10-CM

## 2016-03-31 DIAGNOSIS — Z7901 Long term (current) use of anticoagulants: Secondary | ICD-10-CM

## 2016-03-31 DIAGNOSIS — I10 Essential (primary) hypertension: Secondary | ICD-10-CM

## 2016-03-31 MED ORDER — NITROGLYCERIN 0.4 MG SL SUBL: 0.4000 mg | SUBLINGUAL_TABLET | SUBLINGUAL | 1 refills | Status: DC | PRN

## 2016-03-31 MED ORDER — ATORVASTATIN CALCIUM 20 MG PO TABS: 20.0000 mg | ORAL_TABLET | Freq: Every day | ORAL | 3 refills | Status: DC

## 2016-03-31 MED ORDER — FUROSEMIDE 40 MG PO TABS: 40.0000 mg | ORAL_TABLET | Freq: Every day | ORAL | 3 refills | Status: DC

## 2016-03-31 MED ORDER — NITROGLYCERIN 0.4 MG SL SUBL: 0.4000 mg | SUBLINGUAL_TABLET | SUBLINGUAL | 5 refills | Status: DC | PRN

## 2016-03-31 MED ORDER — POTASSIUM CHLORIDE CRYS ER 20 MEQ PO TBCR: 20.0000 meq | EXTENDED_RELEASE_TABLET | Freq: Every day | ORAL | 3 refills | Status: DC

## 2016-03-31 MED ORDER — CARVEDILOL 3.125 MG PO TABS: 3.1250 mg | ORAL_TABLET | Freq: Two times a day (BID) | ORAL | 3 refills | Status: DC

## 2016-03-31 NOTE — Progress Notes (Signed)
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 She was visiting family in [place] over Thanksgiving 2017 when she suffered an inferior STEMI. Cath was done but apparently they were unable to open her RCA and the recommendation was for medical treatment. [facility] with HCAP and a PE. [facility] and then re admitted 03/12/16 with pleuritic chest pain which we felt was from her PE. We were finally able to obtain her records from her PCI in [place] during that admission. Once we determined she did not have a DES we stopped her Plavix and continued her on Eliquis (for PE) ands ASA 81 mg. She is in the office today for follow up. She says she has been gradually getting stronger. No chest pain. She ran out of her Nitro Dur patch several days ago and hasn't had chest pain, she is in the independent living section of [facility] and manages her medicine herself. .    Current Outpatient Prescriptions  Medication Sig Dispense Refill  . apixaban (ELIQUIS) 5 MG TABS tablet Take 1 tablet (5 mg total) by mouth 2 (two) times daily. 60 tablet 0  . aspirin 81 MG chewable tablet Chew 1 tablet (81 mg total) by mouth daily. 30 tablet 0  . atorvastatin (LIPITOR) 20 MG tablet Take 1 tablet (20 mg total) by mouth daily. 90 tablet 3  . Calcium-Magnesium-Vitamin D (CALCIUM 500 PO) Take 1 tablet by mouth daily.    . carvedilol (COREG) 3.125 MG tablet Take 1 tablet (3.125 mg total) by mouth 2 (two) times daily with a meal. 180 tablet 3  . cholecalciferol (VITAMIN D) 1000 UNITS tablet Take 1,000 Units by mouth every morning.     . furosemide (LASIX) 40 MG tablet Take 1 tablet (40 mg total) by mouth daily. 90 tablet 3  . Multiple Vitamins-Minerals ([facility] SPECTRAVITE ADULT 50+) TABS Take 1 tablet by mouth  daily.    Marland Kitchen. PARoxetine (PAXIL) 20 MG tablet Take 20 mg by mouth 2 (two) times daily.     . polyethylene glycol (MIRALAX / GLYCOLAX) packet Take 17 g by mouth daily as needed. (Patient taking differently: Take 17 g by mouth daily as needed for mild constipation. ) 14 each 0  . potassium chloride SA (K-DUR,KLOR-CON) 20 MEQ tablet Take 1 tablet (20 mEq total) by mouth daily. 90 tablet 3  . nitroGLYCERIN (NITROSTAT) 0.4 MG SL tablet Place 1 tablet (0.4 mg total) under the tongue every 5 (five) minutes as needed for chest pain. 100 tablet 1   No current facility-administered medications for this visit.     No Known Allergies  Social History   Social History  . Marital status: Married    Spouse name: N/A  . Number of children: N/A  . Years of education: N/A   Occupational History  . Not on file.   Social History Main Topics  . Smoking status: Never Smoker  . Smokeless tobacco: Never Used  . Alcohol use 0.0 oz/week     Comment: Rare  . Drug use: No  . Sexual activity: No     Comment: 1st intercourse 83 yo-1 partner   Other Topics Concern  . Not on file   Social History Narrative  . No narrative on file  Review of Systems: General: negative for chills, fever, night sweats or weight changes.  Cardiovascular: negative for chest pain, dyspnea on exertion, edema, orthopnea, palpitations, paroxysmal nocturnal dyspnea or shortness of breath Dermatological: negative for rash Respiratory: negative for cough or wheezing Urologic: negative for hematuria Abdominal: negative for nausea, vomiting, diarrhea, bright red blood per rectum, melena, or hematemesis Neurologic: negative for visual changes, syncope, or dizziness All other systems reviewed and are otherwise negative except as noted above.    Blood pressure 134/71, pulse 76, height 5\' 2"  (1.575 m), weight 138 lb (62.6 kg).  General appearance: alert, cooperative, appears stated age and no distress Neck: no carotid bruit and no  JVD Lungs: few basilar rhonchi Heart: regular rate and rhythm Extremities: no edema Skin: pale, cool, dry Neurologic: Grossly normal  EKG NSR inferior Qs, poor anterior RW, TWI V5-V6  ASSESSMENT AND PLAN:   CAD S/P unsuccessful PCI Nov 2017 Pt had cath and unsuccessful PCI to her RCA in Nov 2017 in [place] in setting of inferior STEMI. She has residual CAD including 80% mid LAD, 70% CFX, 90% OM1, 90% Dx, with an EF of 50%. The RCA was 100% occluded proximally with Lt-Rt collaterals.   HCAP (healthcare-associated pneumonia) Admitted Jan 2018 to [facility], required Lt thoracentesis then  Ischemic cardiomyopathy EF 40-45% by echo Jan 2018  Pulmonary embolus (HCC) RUL PE Jan 2018- Eliquis  Chronic anticoagulation Eliquis for PE. Plavix was stopped during Feb 2018 adm (once we found out no PCI/ stent done in Nov 2017)  Dyslipidemia On low dose statin Rx   PLAN  I told Kristen Lewis as long as she wasn't having chest pain we could hold off on the Nitro Dur patch. We'll have her see Dr Kristen Poche in 3 months, she see's Dr Kristen Lewis as well.   Corine Shelter PA-C 03/31/2016 11:04 AM

## 2016-03-31 NOTE — Patient Instructions (Addendum)
Medication Instructions:  CONTINUE OFF NITROGLYCERIN PATCH  Use your NTG under your tongue for recurrent chest pain. May take one tablet every 5 minutes. If you are still having discomfort after 3 tablets in 15 minutes, call 911.  Labwork: NONE   Testing/Procedures: NONE  Follow-Up: Your physician recommends that you schedule a follow-up appointment in: [facility] OV [facility]   If you need a refill on your cardiac medications before your next appointment, please call your pharmacy.

## 2016-03-31 NOTE — Assessment & Plan Note (Signed)
Eliquis for PE. Plavix was stopped during Feb 2018 adm (once we found out no PCI/ stent done in Nov 2017)

## 2016-03-31 NOTE — Assessment & Plan Note (Signed)
On low dose statin Rx 

## 2016-03-31 NOTE — Assessment & Plan Note (Signed)
RUL PE Jan 2018- Eliquis

## 2016-03-31 NOTE — Assessment & Plan Note (Signed)
EF 40-45% by echo Jan 2018

## 2016-03-31 NOTE — Assessment & Plan Note (Signed)
[facility], required Lt thoracentesis then

## 2016-03-31 NOTE — Assessment & Plan Note (Signed)
Pt had cath and unsuccessful PCI to her RCA in Nov 2017 in [place] in setting of inferior STEMI. She has residual CAD including 80% mid LAD, 70% CFX, 90% OM1, 90% Dx, with an EF of 50%. The RCA was 100% occluded proximally with Lt-Rt collaterals.

## 2016-04-09 ENCOUNTER — Telehealth: Payer: Self-pay | Admitting: *Deleted

## 2016-04-09 NOTE — Telephone Encounter (Signed)
Received a message from Aram BeechamCynthia from medical records about pt Kristen Lewis, she stated pt is having a dental procedure done today and needs to know how long she can hold Eliquis, pt stated she saw Dr Antoine PocheHochrein in February and was told to hold her Eliquis, reviewing pt chart, pt saw Franky MachoLuke on 02/20 but there ws no mention of any dental procedure and how long Eliquis to be held for, spoke with Dr Herbie BaltimoreHarding our DOD today and he stated if pt is having just a regular cleaning Eliquis need to be held for 24 hr and if she's having any invasive work done Eliquis need to be held for 2 days, call and spoke with Jimmey RalphParker at Dr Lorin PicketScott Rehm,DDS and give her Dr Herbie BaltimoreHarding instruction.

## 2016-05-01 ENCOUNTER — Other Ambulatory Visit: Payer: Self-pay | Admitting: Cardiology

## 2016-05-01 MED ORDER — APIXABAN 5 MG PO TABS: 5.0000 mg | ORAL_TABLET | Freq: Two times a day (BID) | ORAL | 1 refills | Status: DC

## 2016-05-01 NOTE — Telephone Encounter (Signed)
°*  STAT* If patient is at the pharmacy, call can be transferred to refill team.   1. Which medications need to be refilled? (please list name of each medication and dose if known) Eliquis-Please call in today if possible please  2. Which pharmacy/location (including street and city if local pharmacy) [phone]  3. Do they need a 30 day or 90 day supply? 90 and refills

## 2016-05-08 ENCOUNTER — Other Ambulatory Visit: Payer: Self-pay | Admitting: Cardiology

## 2016-05-08 ENCOUNTER — Telehealth: Payer: Self-pay | Admitting: Cardiology

## 2016-05-08 MED ORDER — POTASSIUM CHLORIDE CRYS ER 20 MEQ PO TBCR: 20.0000 meq | EXTENDED_RELEASE_TABLET | Freq: Every day | ORAL | 0 refills | Status: DC

## 2016-05-08 MED ORDER — FUROSEMIDE 40 MG PO TABS: 40.0000 mg | ORAL_TABLET | Freq: Every day | ORAL | 0 refills | Status: DC

## 2016-05-08 NOTE — Telephone Encounter (Signed)
Called pt's pharmacy [facility] pharmacy to order pt's medication because stated that she was out and had not called the pharmacy for a refill. I also sent in a refill for her medications Furosemide and potassium chloride, a 10 day supply until pt's mail order arrives. Pt is aware of this. I advised the pt that if she has any other problems, questions or concerns to call our office. Pt verbalized understanding.

## 2016-05-08 NOTE — Telephone Encounter (Signed)
Left detailed message for [facility] for 1 year of refills for pt of lasix and potassium. rx's already sent 03-31-16 by [facility]

## 2016-05-08 NOTE — Telephone Encounter (Signed)
[name] is calling about the Furosomide  and potassium Medication for Kristen Lewis . [name] has not had the medication in a couple of days . When returning the call  please leave the patient name and date of birth . Thanks

## 2016-07-09 ENCOUNTER — Encounter: Payer: Self-pay | Admitting: Cardiology

## 2016-07-09 NOTE — Progress Notes (Signed)
Cardiology Office Note   Date:  07/10/2016   ID:  Kristen Lewis, DOB 09/30/1925, MRN [id number]  PCP:  Gaspar Garbe, MD  Cardiologist:   Rollene Rotunda, MD   Chief Complaint  Patient presents with  . Coronary Artery Disease      History of Present Illness: Kristen Lewis is a 83 y.o. female who presents for follow up of CAD.  She had inferior MI in 2017.  Cath was done but they were unable to open her RCA and the recommendation was for medical treatment. She was admitted to [facility] hospital in Jan 2018 with HCAP and a PE. She was discharged back to [facility] and then re admitted 03/12/16 with pleuritic chest pain which we felt was from her PE.  She has been treated with ASA and Eliquis.  This month is the best that she has felt in 6 months.  She ambulates to the dining hall.  She has not started her exercise classes that she was in.  The patient denies any new symptoms such as chest discomfort, neck or arm discomfort. There has been no new shortness of breath, PND or orthopnea. There have been no reported palpitations, presyncope or syncope.  She does nap during the day.    Past Medical History:  Diagnosis Date  . Arthritis   . CAD (coronary artery disease)    PCI to her RCA in Nov 2017 in [place] in setting of inferior STEMI. Residual 80% LAD, 70% CFX, 90% OM, EF 50%  . Cardiomyopathy (HCC)    EF 45%  . Elevated cholesterol   . Hearing deficit   . Hypertension   . Vaginal pessary present     Past Surgical History:  Procedure Laterality Date  . CARDIAC CATHETERIZATION    . CHOLECYSTECTOMY    . VAGINAL HYSTERECTOMY     Leiomyomata/bleeding     Current Outpatient Prescriptions  Medication Sig Dispense Refill  . apixaban (ELIQUIS) 5 MG TABS tablet Take 1 tablet (5 mg total) by mouth 2 (two) times daily. 180 tablet 1  . aspirin 81 MG chewable tablet Chew 1 tablet (81 mg total) by mouth daily. 30 tablet 0  . atorvastatin (LIPITOR) 20 MG tablet Take 1 tablet (20 mg  total) by mouth daily. 90 tablet 3  . Calcium-Magnesium-Vitamin D (CALCIUM 500 PO) Take 1 tablet by mouth daily.    . carvedilol (COREG) 3.125 MG tablet Take 1 tablet (3.125 mg total) by mouth 2 (two) times daily with a meal. 180 tablet 3  . cholecalciferol (VITAMIN D) 1000 UNITS tablet Take 1,000 Units by mouth every morning.     . furosemide (LASIX) 40 MG tablet Take 1 tablet (40 mg total) by mouth daily. 10 tablet 0  . Multiple Vitamins-Minerals ([employer]S SPECTRAVITE ADULT 50+) TABS Take 1 tablet by mouth daily.    Marland Kitchen PARoxetine (PAXIL) 20 MG tablet Take 20 mg by mouth daily.     . potassium chloride SA (K-DUR,KLOR-CON) 20 MEQ tablet Take 1 tablet (20 mEq total) by mouth daily. 10 tablet 0  . nitroGLYCERIN (NITROSTAT) 0.4 MG SL tablet Place 1 tablet (0.4 mg total) under the tongue every 5 (five) minutes as needed for chest pain. 100 tablet 1   No current facility-administered medications for this visit.     Allergies:   Patient has no known allergies.    ROS:  Please see the history of present illness.   Otherwise, review of systems are positive for none.  All other systems are reviewed and negative.    PHYSICAL EXAM: VS:  BP 104/60   Pulse 72   Ht 5\' 2"  (1.575 m)   Wt 135 lb (61.2 kg)   BMI 24.69 kg/m  , BMI Body mass index is 24.69 kg/m.  GENERAL:  Well appearing but slightly frail NECK:  No jugular venous distention, waveform within normal limits, carotid upstroke brisk and symmetric, no bruits, no thyromegaly LUNGS:  Clear to auscultation bilaterally BACK:  No CVA tenderness, lordosis CHEST:  Unremarkable HEART:  PMI not displaced or sustained,S1 and S2 within normal limits, no S3, no S4, no clicks, no rubs, no murmurs ABD:  Flat, positive bowel sounds normal in frequency in pitch, no bruits, no rebound, no guarding, no midline pulsatile mass, no hepatomegaly, no splenomegaly EXT:  2 plus pulses throughout, no edema, no cyanosis no clubbing   EKG:  EKG is not ordered  today.   Recent Labs: 02/19/2016: ALT 15; Magnesium 1.7 03/12/2016: B Natriuretic Peptide 637.2; BUN 13; Creatinine, Ser 0.89; Hemoglobin 9.5; Platelets 220; Potassium 3.5; Sodium 135; TSH 4.051    Lipid Panel No results found for: CHOL, TRIG, HDL, CHOLHDL, VLDL, LDLCALC, LDLDIRECT    Wt Readings from Last 3 Encounters:  07/10/16 135 lb (61.2 kg)  03/31/16 138 lb (62.6 kg)  03/13/16 130 lb 9.6 oz (59.2 kg)      Other studies Reviewed: Additional studies/ records that were reviewed today include: Office records. Review of the above records demonstrates:  Please see elsewhere in the note.     ASSESSMENT AND PLAN:   CAD S/P unsuccessful PCI Nov 2017 I would like for her to continue ASA and Eliquis for now given the extent of her disease.  I will consider stopping the DOAC when I see her next.   Ischemic cardiomyopathy EF 40-45% by echo Jan 2018.  I will follow this clinically.   Pulmonary embolus (HCC) RUL PE Jan 2018- Eliquis.  As above.  I will likely stop the Eliquis when I see her next.    Dyslipidemia I will defer to Tisovec, Adelfa Kohichard W, MD    Current medicines are reviewed at length with the patient today.  The patient does not have concerns regarding medicines.  The following changes have been made:  no change  Labs/ tests ordered today include: None No orders of the defined types were placed in this encounter.    Disposition:   FU with me in six months.     Signed, Rollene RotundaJames Poppi Scantling, MD  07/10/2016 2:11 PM    [facility]

## 2016-07-10 ENCOUNTER — Encounter: Payer: Self-pay | Admitting: Cardiology

## 2016-07-10 ENCOUNTER — Ambulatory Visit (INDEPENDENT_AMBULATORY_CARE_PROVIDER_SITE_OTHER): Payer: Medicare Other | Admitting: Cardiology

## 2016-07-10 VITALS — BP 104/60 | HR 72 | Ht 62.0 in | Wt 135.0 lb

## 2016-07-10 DIAGNOSIS — I2782 Chronic pulmonary embolism: Secondary | ICD-10-CM | POA: Diagnosis not present

## 2016-07-10 DIAGNOSIS — I255 Ischemic cardiomyopathy: Secondary | ICD-10-CM | POA: Diagnosis not present

## 2016-07-10 DIAGNOSIS — I2510 Atherosclerotic heart disease of native coronary artery without angina pectoris: Secondary | ICD-10-CM | POA: Diagnosis not present

## 2016-07-10 NOTE — Patient Instructions (Signed)

## 2016-08-04 ENCOUNTER — Other Ambulatory Visit: Payer: Self-pay | Admitting: *Deleted

## 2016-08-04 MED ORDER — POTASSIUM CHLORIDE CRYS ER 20 MEQ PO TBCR
20.0000 meq | EXTENDED_RELEASE_TABLET | Freq: Every day | ORAL | 1 refills | Status: DC
Start: 2016-08-04 — End: 2016-12-09

## 2016-09-15 ENCOUNTER — Ambulatory Visit (INDEPENDENT_AMBULATORY_CARE_PROVIDER_SITE_OTHER): Payer: Medicare Other | Admitting: Gynecology

## 2016-09-15 ENCOUNTER — Encounter: Payer: Self-pay | Admitting: Gynecology

## 2016-09-15 VITALS — BP 124/74

## 2016-09-15 DIAGNOSIS — R300 Dysuria: Secondary | ICD-10-CM

## 2016-09-15 DIAGNOSIS — N819 Female genital prolapse, unspecified: Secondary | ICD-10-CM | POA: Diagnosis not present

## 2016-09-15 LAB — URINALYSIS W MICROSCOPIC + REFLEX CULTURE
Bilirubin Urine: NEGATIVE
CASTS: NONE SEEN [LPF]
Crystals: NONE SEEN [HPF]
Glucose, UA: NEGATIVE
KETONES UR: NEGATIVE
NITRITE: NEGATIVE
Protein, ur: NEGATIVE
SPECIFIC GRAVITY, URINE: 1.015 (ref 1.001–1.035)
YEAST: NONE SEEN [HPF]
pH: 7 (ref 5.0–8.0)

## 2016-09-15 MED ORDER — SULFAMETHOXAZOLE-TRIMETHOPRIM 800-160 MG PO TABS: 1.0000 | ORAL_TABLET | Freq: Two times a day (BID) | ORAL | 0 refills | Status: DC

## 2016-09-15 NOTE — Patient Instructions (Addendum)
Take the antibiotic twice daily for days  Follow up in 6 months for pessary recheck

## 2016-09-15 NOTE — Addendum Note (Signed)
Addended by: Dayna BarkerGARDNER, Jaylon Grode K on: 09/15/2016 02:11 PM   Modules accepted: Orders

## 2016-09-15 NOTE — Progress Notes (Signed)
    Kristen Lewis February 13, 1925 [id number]        83 y.o.  [id number] presents for pessary check with history of vaginal vault prolapse after hysterectomy. Is using Gellhorn pessary with good results. Also notes some in stream mild dysuria this been going on for months. No frequency, dysuria, urgency or low back pain. No fever or chills. Reports having recent urinalysis checked at her primary physician's office which was negative.  Past medical history,surgical history, problem list, medications, allergies, family history and social history were all reviewed and documented in the EPIC chart.  Directed ROS with pertinent positives and negatives documented in the history of present illness/assessment and plan.  Exam: Kennon PortelaKim Lewis assistant Vitals:   09/15/16 1210  BP: 124/74   General appearance:  Normal Abdomen soft nontender without masses guarding rebound Pelvic external BUS vagina with atrophic changes. Gellhorn pessary was removed, cleansed and replaced without difficulty. Vaginal mucosa without evidence of significant irritation or erosion. Vault prolapse and rectocele noted after removal of the pessary. Bimanual exam without masses or tenderness.  Assessment/Plan:  83 y.o. [name] with good response to Gellhorn pessary for vaginal vault prolapse. Is having some end stream dysuria. Urinalysis today does show moderate bacteria with 20-40 WBC. She does have some squamous cells which may be contamination. Discussed these possibilities with the patient. We'll go ahead and treat for UTI and see if this does not eliminate her symptoms. Septra DS 1 by mouth twice a day 5 days prescribed. Follow up if symptoms persist, worsen or recur. Possibilities for estrogen vaginal support if symptoms continue also discussed the treat atrophic changes which may be related to this discomfort also.  Greater than 50% of my time was spent in direct face to face counseling and coordination of care with the  patient.     Kristen LordsFONTAINE,[name] MD, 12:22 PM 09/15/2016

## 2016-09-16 ENCOUNTER — Ambulatory Visit: Payer: Medicare Other | Admitting: Gynecology

## 2016-09-16 LAB — URINE CULTURE: ORGANISM ID, BACTERIA: NO GROWTH

## 2016-10-31 ENCOUNTER — Other Ambulatory Visit: Payer: Self-pay | Admitting: Cardiology

## 2016-12-09 ENCOUNTER — Other Ambulatory Visit: Payer: Self-pay | Admitting: Cardiology

## 2016-12-09 NOTE — Telephone Encounter (Signed)
Rx has been sent to the pharmacy electronically. ° °

## 2017-02-23 ENCOUNTER — Other Ambulatory Visit: Payer: Self-pay

## 2017-02-23 MED ORDER — POTASSIUM CHLORIDE CRYS ER 20 MEQ PO TBCR: 20.0000 meq | EXTENDED_RELEASE_TABLET | Freq: Every day | ORAL | 1 refills | Status: DC

## 2017-02-25 ENCOUNTER — Ambulatory Visit: Payer: Medicare Other | Admitting: Cardiology

## 2017-02-25 NOTE — Progress Notes (Signed)
Cardiology Office Note   Date:  02/26/2017   ID:  Kristen Lewis, DOB 1925/04/24, MRN [id number]  PCP:  Gaspar Garbe, MD  Cardiologist:   Rollene Rotunda, MD   Chief Complaint  Patient presents with  . Coronary Artery Disease      History of Present Illness: Kristen Lewis is a 84 y.o. female who presents for follow up of CAD.  She had inferior MI in 2017.  Cath was done but they were unable to open her RCA and the recommendation was for medical treatment. She was admitted to [facility] hospital in Jan 2018 with HCAP and a PE. She was discharged back to [facility] and then re admitted 03/12/16 with pleuritic chest pain which we felt was from her PE.  She has been treated with ASA and Eliquis.    Since I last saw her well.  She denies any cardiovascular symptoms.  She does some exercising which she just started with this she denies any cardiovascular symptoms.  She does some walking.  The patient denies any new symptoms such as chest discomfort, neck or arm discomfort. There has been no new shortness of breath, PND or orthopnea. There have been no reported palpitations, presyncope or syncope.   Past Medical History:  Diagnosis Date  . Arthritis   . CAD (coronary artery disease)    PCI to her RCA in Nov 2017 in [place] in setting of inferior STEMI. Residual 80% LAD, 70% CFX, 90% OM, EF 50%  . Cardiomyopathy (HCC)    EF 45%  . Elevated cholesterol   . Hearing deficit   . Hypertension   . Vaginal pessary present     Past Surgical History:  Procedure Laterality Date  . CARDIAC CATHETERIZATION    . CHOLECYSTECTOMY    . VAGINAL HYSTERECTOMY     Leiomyomata/bleeding     Current Outpatient Medications  Medication Sig Dispense Refill  . atorvastatin (LIPITOR) 20 MG tablet Take 1 tablet (20 mg total) by mouth daily. 90 tablet 3  . Calcium-Magnesium-Vitamin D (CALCIUM 500 PO) Take 1 tablet by mouth daily.    . carvedilol (COREG) 3.125 MG tablet Take 1 tablet (3.125 mg total) by  mouth 2 (two) times daily with a meal. 180 tablet 3  . cholecalciferol (VITAMIN D) 1000 UNITS tablet Take 1,000 Units by mouth every morning.     Marland Kitchen ELIQUIS 5 MG TABS tablet TAKE 1 TABLET BY MOUTH TWICE A DAY 180 tablet 1  . furosemide (LASIX) 40 MG tablet Take 1 tablet (40 mg total) by mouth daily. 10 tablet 0  . lisinopril (PRINIVIL,ZESTRIL) 20 MG tablet Take 20 mg by mouth daily.    . Multiple Vitamins-Minerals (CVS SPECTRAVITE ADULT 50+) TABS Take 1 tablet by mouth daily.    Marland Kitchen PARoxetine (PAXIL) 20 MG tablet Take 20 mg by mouth daily.     . potassium chloride SA (K-DUR,KLOR-CON) 20 MEQ tablet Take 1 tablet (20 mEq total) by mouth daily. 90 tablet 1  . nitroGLYCERIN (NITROSTAT) 0.4 MG SL tablet Place 1 tablet (0.4 mg total) under the tongue every 5 (five) minutes as needed for chest pain. 100 tablet 1  . sulfamethoxazole-trimethoprim (BACTRIM DS,SEPTRA DS) 800-160 MG tablet Take 1 tablet by mouth 2 (two) times daily. 10 tablet 0   No current facility-administered medications for this visit.     Allergies:   Patient has no known allergies.    ROS:  Please see the history of present illness.   Otherwise,  review of systems are positive for sinus drainage.   All other systems are reviewed and negative.    PHYSICAL EXAM: VS:  BP 110/68   Pulse 67   Ht 5' 1.5" (1.562 m)   Wt 140 lb (63.5 kg)   BMI 26.02 kg/m  , BMI Body mass index is 26.02 kg/m.  GENERAL:  Well appearing NECK:  No jugular venous distention, waveform within normal limits, carotid upstroke brisk and symmetric, no bruits, no thyromegaly LUNGS:  Clear to auscultation bilaterally CHEST:  Unremarkable HEART:  PMI not displaced or sustained,S1 and S2 within normal limits, no S3, no S4, no clicks, no rubs, no murmurs ABD:  Flat, positive bowel sounds normal in frequency in pitch, no bruits, no rebound, no guarding, no midline pulsatile mass, no hepatomegaly, no splenomegaly EXT:  2 plus pulses throughout, no edema, no cyanosis  no clubbing   EKG:  EKG is done ordered today. Old inferior infarct, possible left atrial enlargement, possible lateral infarct no significant change from previous.  Recent Labs: 03/12/2016: B Natriuretic Peptide 637.2; BUN 13; Creatinine, Ser 0.89; Hemoglobin 9.5; Platelets 220; Potassium 3.5; Sodium 135; TSH 4.051    Lipid Panel No results found for: CHOL, TRIG, HDL, CHOLHDL, VLDL, LDLCALC, LDLDIRECT    Wt Readings from Last 3 Encounters:  02/26/17 140 lb (63.5 kg)  07/10/16 135 lb (61.2 kg)  03/31/16 138 lb (62.6 kg)      Other studies Reviewed: Additional studies/ records that were reviewed today include: Sinus rhythm, rate 67, left axis deviation, Review of the above records demonstrates:    ASSESSMENT AND PLAN:   CAD S/P unsuccessful PCI Nov 2017 The patient has no new symptoms.  She needs to be on Eliquis as below.  I think I would stop the aspirin at this point because she is at higher risk of bleeding with her age.  Otherwise no cardiovascular testing is indicated.   Ischemic cardiomyopathy EF 40-45% by echo Jan 2018.  She has no symptoms.  No further imaging is planned.   Pulmonary embolus (HCC) RUL PE Jan 2018- Eliquis.  I did review the records from her hospitalization and they suggested that this was an unprovoked pulmonary embolism and therefore I would suggest continuing the Eliquis but I will stop the aspirin.   Dyslipidemia Her LDL was 62 with an HDL of 37.  She will continue on the meds as listed.   Current medicines are reviewed at length with the patient today.  The patient does not have concerns regarding medicines.  The following changes have been made:  As above  Labs/ tests ordered today include:   Orders Placed This Encounter  Procedures  . EKG 12-Lead     Disposition:   FU with me in 12months.     Signed, [name], MD  02/26/2017 3:12 PM    [facility]

## 2017-02-26 ENCOUNTER — Ambulatory Visit: Payer: Medicare Other | Admitting: Cardiology

## 2017-02-26 ENCOUNTER — Encounter: Payer: Self-pay | Admitting: Cardiology

## 2017-02-26 VITALS — BP 110/68 | HR 67 | Ht 61.5 in | Wt 140.0 lb

## 2017-02-26 DIAGNOSIS — I255 Ischemic cardiomyopathy: Secondary | ICD-10-CM

## 2017-02-26 DIAGNOSIS — I2510 Atherosclerotic heart disease of native coronary artery without angina pectoris: Secondary | ICD-10-CM

## 2017-02-26 NOTE — Patient Instructions (Signed)
Medication Instructions:  STOP- Aspirin  If you need a refill on your cardiac medications before your next appointment, please call your pharmacy.  Labwork: None Ordered    Testing/Procedures: None Ordered  Follow-Up: Your physician wants you to follow-up in: 1 Year. You should receive a reminder letter in the mail two months in advance. If you do not receive a letter, please call our office [phone].      Thank you for choosing [facility]!!       

## 2017-03-24 ENCOUNTER — Other Ambulatory Visit: Payer: Self-pay | Admitting: Cardiology

## 2017-04-03 ENCOUNTER — Other Ambulatory Visit: Payer: Self-pay | Admitting: Cardiology

## 2017-04-12 ENCOUNTER — Other Ambulatory Visit: Payer: Self-pay | Admitting: Cardiology

## 2017-04-25 ENCOUNTER — Other Ambulatory Visit: Payer: Self-pay | Admitting: Cardiology

## 2017-05-21 ENCOUNTER — Other Ambulatory Visit: Payer: Self-pay | Admitting: Cardiology

## 2017-05-21 MED ORDER — CARVEDILOL 3.125 MG PO TABS: ORAL_TABLET | ORAL | 3 refills | Status: DC

## 2017-05-21 NOTE — Telephone Encounter (Signed)
Received fax from [facility] that states rx for pt's Carvedilol needed to be signed under a doctor not by APP. New rx was re-sent in with pt's primary cardiologist.

## 2017-09-20 NOTE — Progress Notes (Signed)
Cardiology Office Note   Date:  09/22/2017   ID:  Kristen Lewis, DOB 02-Jul-1925, MRN [id number]  PCP:  Gaspar Garbeisovec, Richard W, MD  Cardiologist:   Rollene RotundaJames Danity Schmelzer, MD   Chief Complaint  Patient presents with  . Coronary Artery Disease      History of Present Illness: Kristen Lewis is a 84 y.o. female who presents for follow up of CAD.  She had inferior MI in 2017.  Cath was done but they were unable to open her RCA and the recommendation was for medical treatment. She was admitted [facility] hospital in Jan 2018 with HCAP and a PE. She was discharged back to [facility] and then re admitted 03/12/16 with pleuritic chest pain which we felt was from her PE.  She has been treated with ASA and Eliquis.    Since I last saw her she has done well.  The patient denies any new symptoms such as chest discomfort, neck or arm discomfort. There has been no new shortness of breath, PND or orthopnea. There have been no reported palpitations, presyncope or syncope.  She walks quickly with a walker 2 x per day to the dinging hall.   Past Medical History:  Diagnosis Date  . Arthritis   . CAD (coronary artery disease)    PCI to her RCA in Nov 2017 in [place] in setting of inferior STEMI. Residual 80% LAD, 70% CFX, 90% OM, EF 50%  . Cardiomyopathy (HCC)    EF 45%  . Elevated cholesterol   . Hearing deficit   . Hypertension   . Vaginal pessary present     Past Surgical History:  Procedure Laterality Date  . CARDIAC CATHETERIZATION    . CHOLECYSTECTOMY    . VAGINAL HYSTERECTOMY     Leiomyomata/bleeding     Current Outpatient Medications  Medication Sig Dispense Refill  . atorvastatin (LIPITOR) 20 MG tablet TAKE 1 TABLET BY MOUTH  DAILY 90 tablet 3  . Calcium-Magnesium-Vitamin D (CALCIUM 500 PO) Take 1 tablet by mouth daily.    . carvedilol (COREG) 3.125 MG tablet TAKE 1 TABLET BY MOUTH TWO  TIMES DAILY WITH A MEAL 180 tablet 3  . cholecalciferol (VITAMIN D) 1000 UNITS tablet Take 1,000 Units  by mouth every morning.     Marland Kitchen. ELIQUIS 5 MG TABS tablet TAKE 1 TABLET BY MOUTH TWICE A DAY 180 tablet 3  . furosemide (LASIX) 40 MG tablet TAKE 1 TABLET BY MOUTH  DAILY 90 tablet 3  . lisinopril (PRINIVIL,ZESTRIL) 20 MG tablet Take 20 mg by mouth daily.    . Multiple Vitamins-Minerals (CVS SPECTRAVITE ADULT 50+) TABS Take 1 tablet by mouth daily.    Marland Kitchen. PARoxetine (PAXIL) 20 MG tablet Take 20 mg by mouth daily.     . nitroGLYCERIN (NITROSTAT) 0.4 MG SL tablet Place 1 tablet (0.4 mg total) under the tongue every 5 (five) minutes as needed for chest pain. 25 tablet 2   No current facility-administered medications for this visit.     Allergies:   Patient has no known allergies.    ROS:  Please see the history of present illness.   Otherwise, review of systems are positive for none.   All other systems are reviewed and negative.    PHYSICAL EXAM: VS:  BP 128/78   Pulse 66   Ht 5\' 2"  (1.575 m)   Wt 139 lb (63 kg)   BMI 25.42 kg/m  , BMI Body mass index is 25.42 kg/m.  GENERAL:  Well appearing NECK:  No jugular venous distention, waveform within normal limits, carotid upstroke brisk and symmetric, no bruits, no thyromegaly LUNGS:  Clear to auscultation bilaterally CHEST:  Unremarkable HEART:  PMI not displaced or sustained,S1 and S2 within normal limits, no S3, no S4, no clicks, no rubs, no murmurs ABD:  Flat, positive bowel sounds normal in frequency in pitch, no bruits, no rebound, no guarding, no midline pulsatile mass, no hepatomegaly, no splenomegaly EXT:  2 plus pulses throughout, no edema, no cyanosis no clubbing   EKG:  EKG is not done ordered today.  Recent Labs: No results found for requested labs within last 8760 hours.    Lipid Panel No results found for: CHOL, TRIG, HDL, CHOLHDL, VLDL, LDLCALC, LDLDIRECT    Wt Readings from Last 3 Encounters:  09/22/17 139 lb (63 kg)  02/26/17 140 lb (63.5 kg)  07/10/16 135 lb (61.2 kg)      Other studies Reviewed: Additional  studies/ records that were reviewed today include: None Review of the above records demonstrates:     ASSESSMENT AND PLAN:   CAD S/P unsuccessful PCI Nov 2017 The patient has no new symptoms.  No change in therapy.   Ischemic cardiomyopathy EF 40-45% by echo Jan 2018.  She has no symptoms.  She seems to be euvolemic.  No change in therapy.   Pulmonary embolus (HCC) She is on this indefinitely as this was an unprovoked PE.  I think that she needs to continue on Eliquis alone.  I had a detailed discussion about this with the patient and her daughter.  They understand and agree.   Dyslipidemia Her LDL was  56.  HDL was 40.    Current medicines are reviewed at length with the patient today.  The patient does not have concerns regarding medicines.  The following changes have been made:  None  Labs/ tests ordered today include:    No orders of the defined types were placed in this encounter.    Disposition:   FU with me in 12 months.     Signed, Rollene RotundaJames Mahira Petras, MD  09/22/2017 10:04 AM     [facility]

## 2017-09-21 ENCOUNTER — Ambulatory Visit: Payer: Medicare Other | Admitting: Cardiology

## 2017-09-22 ENCOUNTER — Ambulatory Visit: Payer: Medicare Other | Admitting: Cardiology

## 2017-09-22 ENCOUNTER — Encounter: Payer: Self-pay | Admitting: Cardiology

## 2017-09-22 VITALS — BP 128/78 | HR 66 | Ht 62.0 in | Wt 139.0 lb

## 2017-09-22 DIAGNOSIS — I2510 Atherosclerotic heart disease of native coronary artery without angina pectoris: Secondary | ICD-10-CM

## 2017-09-22 DIAGNOSIS — I2699 Other pulmonary embolism without acute cor pulmonale: Secondary | ICD-10-CM | POA: Diagnosis not present

## 2017-09-22 DIAGNOSIS — I420 Dilated cardiomyopathy: Secondary | ICD-10-CM | POA: Diagnosis not present

## 2017-09-22 MED ORDER — NITROGLYCERIN 0.4 MG SL SUBL: 0.4000 mg | SUBLINGUAL_TABLET | SUBLINGUAL | 2 refills | Status: DC | PRN

## 2017-09-22 NOTE — Patient Instructions (Signed)
Medication Instructions:  Continue current medications  If you need a refill on your cardiac medications before your next appointment, please call your pharmacy.  Labwork: None Ordered    Testing/Procedures: None Ordered  Follow-Up: Your physician wants you to follow-up in: 1 Year. You should receive a reminder letter in the mail two months in advance. If you do not receive a letter, please call our office to schedule your follow-up appointment.    Thank you for choosing [facility]!!       

## 2017-12-31 ENCOUNTER — Other Ambulatory Visit: Payer: Self-pay | Admitting: Cardiology

## 2017-12-31 MED ORDER — FUROSEMIDE 40 MG PO TABS: 40.0000 mg | ORAL_TABLET | Freq: Every day | ORAL | 3 refills | Status: DC

## 2017-12-31 NOTE — Telephone Encounter (Signed)
. °*  STAT* If patient is at the pharmacy, call can be transferred to refill team.   1. Which medications need to be refilled? (please list name of each medication and dose if known) Furosemide  2. Which pharmacy/location (including street and city if local pharmacy) is medication to be sent to?[facility]  3. Do they need a 30 day or 90 day supply? 90 and refills

## 2018-02-08 ENCOUNTER — Other Ambulatory Visit: Payer: Self-pay | Admitting: Cardiology

## 2018-02-14 ENCOUNTER — Telehealth: Payer: Self-pay | Admitting: Cardiology

## 2018-02-14 NOTE — Telephone Encounter (Signed)
New Message       She works wiih the patient in [employer]. She wanted you to know the patient have lost 5.5lbs over the past 3 days. Patientt is feeling fine. She said the patient she ate more over the holidays.

## 2018-02-16 NOTE — Telephone Encounter (Signed)
Noted  

## 2018-03-26 NOTE — Progress Notes (Signed)
Cardiology Office Note   Date:  03/29/2018   ID:  MAKINZEE ARBUTHNOT, DOB [birth year], MRN [id number]  PCP:  Gaspar Garbe, MD  Cardiologist:   Rollene Rotunda, MD   Chief Complaint  Patient presents with  . Coronary Artery Disease      History of Present Illness: Kristen Lewis is a 85 y.o. female who presents for follow up of CAD.  She had inferior MI in 2017.  Cath was done but they were unable to open her RCA and the recommendation was for medical treatment. She was admitted to [facility] hospital in Jan 2018 with HCAP and a PE. She was discharged back to [facility] and then re admitted 03/12/16 with pleuritic chest pain which we felt was from her PE.  She has been treated with ASA and Eliquis. At the last visit I stopped the ASA.Marland Kitchen    She returns for follow up.  She is done well.  She does some exercising at [facility].  She walks to the dining hall. The patient denies any new symptoms such as chest discomfort, neck or arm discomfort. There has been no new shortness of breath, PND or orthopnea. There have been no reported palpitations, presyncope or syncope.   Past Medical History:  Diagnosis Date  . Arthritis   . CAD (coronary artery disease)    PCI to her RCA in Nov 2017 in [place] in setting of inferior STEMI. Residual 80% LAD, 70% CFX, 90% OM, EF 50%  . Cardiomyopathy (HCC)    EF 45%  . Elevated cholesterol   . Hearing deficit   . Hypertension   . Vaginal pessary present     Past Surgical History:  Procedure Laterality Date  . CARDIAC CATHETERIZATION    . CHOLECYSTECTOMY    . VAGINAL HYSTERECTOMY     Leiomyomata/bleeding     Current Outpatient Medications  Medication Sig Dispense Refill  . atorvastatin (LIPITOR) 20 MG tablet TAKE 1 TABLET BY MOUTH  DAILY 90 tablet 2  . Calcium-Magnesium-Vitamin D (CALCIUM 500 PO) Take 1 tablet by mouth daily.    . carvedilol (COREG) 3.125 MG tablet TAKE 1 TABLET BY MOUTH TWO  TIMES DAILY WITH A MEAL 180 tablet 3  .  cholecalciferol (VITAMIN D) 1000 UNITS tablet Take 1,000 Units by mouth every morning.     Marland Kitchen ELIQUIS 5 MG TABS tablet TAKE 1 TABLET BY MOUTH TWICE A DAY 180 tablet 3  . furosemide (LASIX) 40 MG tablet Take 1 tablet (40 mg total) by mouth daily. 90 tablet 3  . lisinopril (PRINIVIL,ZESTRIL) 20 MG tablet Take 20 mg by mouth daily.    . Multiple Vitamins-Minerals ([facility] SPECTRAVITE ADULT 50+) TABS Take 1 tablet by mouth daily.    Marland Kitchen PARoxetine (PAXIL) 20 MG tablet Take 20 mg by mouth daily.     . nitroGLYCERIN (NITROSTAT) 0.4 MG SL tablet Place 1 tablet (0.4 mg total) under the tongue every 5 (five) minutes as needed for chest pain. 25 tablet 2   No current facility-administered medications for this visit.     Allergies:   Patient has no known allergies.    ROS:  Please see the history of present illness.   Otherwise, review of systems are positive for none.   All other systems are reviewed and negative.    PHYSICAL EXAM: VS:  BP 138/78   Pulse 100   Ht 5\' 2"  (1.575 m)   Wt 141 lb 12.8 oz (64.3 kg)  BMI 25.94 kg/m  , BMI Body mass index is 25.94 kg/m.  GENERAL:  Well appearing NECK:  No jugular venous distention, waveform within normal limits, carotid upstroke brisk and symmetric, no bruits, no thyromegaly LUNGS:  Clear to auscultation bilaterally CHEST:  Unremarkable HEART:  PMI not displaced or sustained,S1 and S2 within normal limits, no S3, no S4, no clicks, no rubs, no murmurs ABD:  Flat, positive bowel sounds normal in frequency in pitch, no bruits, no rebound, no guarding, no midline pulsatile mass, no hepatomegaly, no splenomegaly EXT:  2 plus pulses throughout, no edema, no cyanosis no clubbing   EKG:  EKG is  done ordered today. Sinus rhythm, rate 100, LAD, intervals within normal limits, probable old lateral MI.     Recent Labs: No results found for requested labs within last 8760 hours.    Lipid Panel No results found for: CHOL, TRIG, HDL, CHOLHDL, VLDL, LDLCALC,  LDLDIRECT    Wt Readings from Last 3 Encounters:  03/29/18 141 lb 12.8 oz (64.3 kg)  09/22/17 139 lb (63 kg)  02/26/17 140 lb (63.5 kg)      Other studies Reviewed: Additional studies/ records that were reviewed today include: Labs  Review of the above records demonstrates:  See below   ASSESSMENT AND PLAN:   CAD S/P unsuccessful PCI Nov 2017 The patient has no new symptoms.  We can manage her conservatively given her advanced age.  No change in therapy.   Ischemic cardiomyopathy I would not suspect volume overload.  She is followed by [employer] at her nursing home.  She is had no acute complaints change in weight or evidence of volume overload.  No change in therapy.  Pulmonary embolus (HCC) She is on this indefinitely as this was an unprovoked PE.  For maintenance therapy I will reduce her 2.5 mg twice daily.  I had previously discussed with her and her daughter the risk benefits of continued anticoagulation and I think it is justified.  Dyslipidemia Patient has had a blood work done the other day and her LDL was 52 with an HDL of 39.  She will continue the meds as listed.  Current medicines are reviewed at length with the patient today.  The patient does not have concerns regarding medicines.  The following changes have been made:  As above  Labs/ tests ordered today include:  None  Orders Placed This Encounter  Procedures  . EKG 12-Lead     Disposition:   FU with me in 12 months.     Signed, Rollene Rotunda, MD  03/29/2018 3:30 PM    [facility]

## 2018-03-29 ENCOUNTER — Ambulatory Visit: Payer: Medicare Other | Admitting: Cardiology

## 2018-03-29 ENCOUNTER — Encounter: Payer: Self-pay | Admitting: Cardiology

## 2018-03-29 VITALS — BP 138/78 | HR 100 | Ht 62.0 in | Wt 141.8 lb

## 2018-03-29 DIAGNOSIS — I255 Ischemic cardiomyopathy: Secondary | ICD-10-CM | POA: Diagnosis not present

## 2018-03-29 DIAGNOSIS — I2510 Atherosclerotic heart disease of native coronary artery without angina pectoris: Secondary | ICD-10-CM | POA: Diagnosis not present

## 2018-03-29 NOTE — Patient Instructions (Signed)
Medication Instructions:  DECREASE- Eliquis 2.5 mg twice a day  If you need a refill on your cardiac medications before your next appointment, please call your pharmacy.  Labwork: None Ordered   Take the provided lab slips with you to the lab for your blood draw.   When you have your labs (blood work) drawn today and your tests are completely normal, you will receive your results only by MyChart Message (if you have MyChart) -OR-  A paper copy in the mail.  If you have any lab test that is abnormal or we need to change your treatment, we will call you to review these results.  Testing/Procedures: None Ordered  Follow-Up: You will need a follow up appointment in 1 Year.  Please call our office 2 months in advance to schedule this appointment.  You may see Rollene Rotunda, MD or one of the following Advanced Practice Providers on your designated Care Team:   Theodore Demark, PA-C . Joni Reining, DNP, ANP   At [facility], you and your health needs are our priority.  As part of our continuing mission to provide you with exceptional heart care, we have created designated Provider Care Teams.  These Care Teams include your primary Cardiologist (physician) and Advanced Practice Providers (APPs -  Physician Assistants and Nurse Practitioners) who all work together to provide you with the care you need, when you need it.   Thank you for choosing [facility] at [facility]!!

## 2018-06-04 ENCOUNTER — Other Ambulatory Visit: Payer: Self-pay | Admitting: Cardiology

## 2018-06-14 ENCOUNTER — Other Ambulatory Visit: Payer: Self-pay | Admitting: Cardiology

## 2018-06-15 NOTE — Telephone Encounter (Signed)
Carvedilol 3.125 mg refilled. 

## 2018-07-28 ENCOUNTER — Encounter: Payer: Self-pay | Admitting: Gynecology

## 2018-08-01 ENCOUNTER — Telehealth: Payer: Self-pay | Admitting: Cardiology

## 2018-08-01 NOTE — Telephone Encounter (Signed)
Will forward to Dr Percival Spanish for review .Adonis Housekeeper

## 2018-08-01 NOTE — Telephone Encounter (Signed)
[facility] called to inform us of patients BP readings of 96/52, 98/44, 94/50, patient lives in assisted living, the nurse there got the same reading. She is having no symptoms, HR has been 53-60 range.

## 2018-08-03 MED ORDER — APIXABAN 2.5 MG PO TABS: 2.5000 mg | ORAL_TABLET | Freq: Two times a day (BID) | ORAL | 5 refills | Status: DC

## 2018-08-03 NOTE — Telephone Encounter (Signed)
Advised patient, verbalized understanding   Patient was discussing which medication was cut in half at last office visit. Reviewed visit from 03/2018 and [facility]

## 2018-08-03 NOTE — Telephone Encounter (Signed)
No change in therapy.  Call Ms. Goering with the results and send results to Tisovec, Fransico Him, MD

## 2018-09-14 IMAGING — CT CT ANGIO CHEST
2 of 7 series · 18 of 46 positions shown · IV contrast (isovue)
Comparison: Chest radiograph 02/17/2016

CLINICAL DATA: Shortness of breath

EXAM:
CT ANGIOGRAPHY CHEST WITH CONTRAST
TECHNIQUE: Multidetector CT imaging of the chest was performed using the
standard protocol during bolus administration of intravenous
contrast. Multiplanar CT image reconstructions and MIPs were
obtained to evaluate the vascular anatomy.
CONTRAST:  100 mL Isovue 370 IV

[Series 5: thins · axial · 0.60mm/px · z∈[+1525,+1754]mm · 16 of 257 slices shown]
[im 14/257  lung]
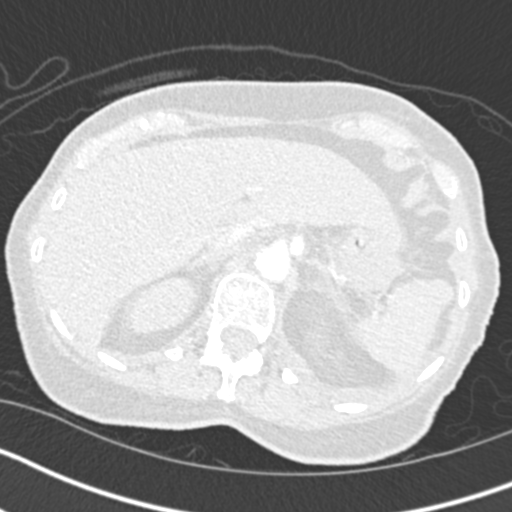
[im 27/257  soft-tissue]
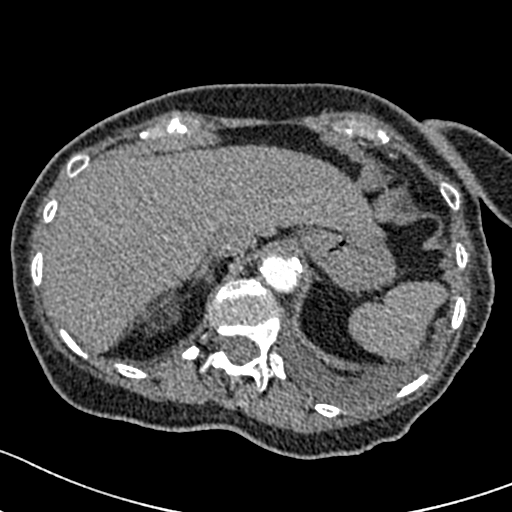
[im 41/257  lung]
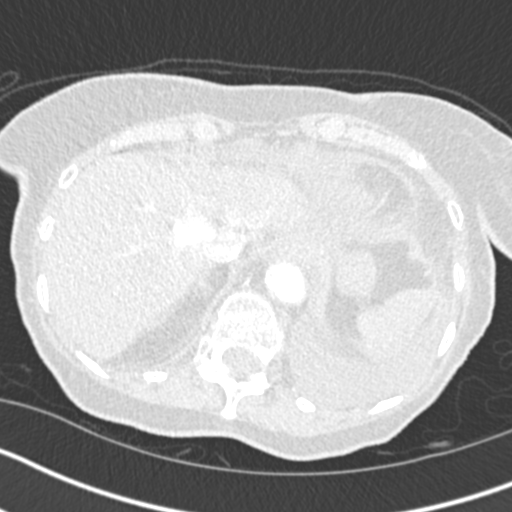
[im 54/257  soft-tissue]
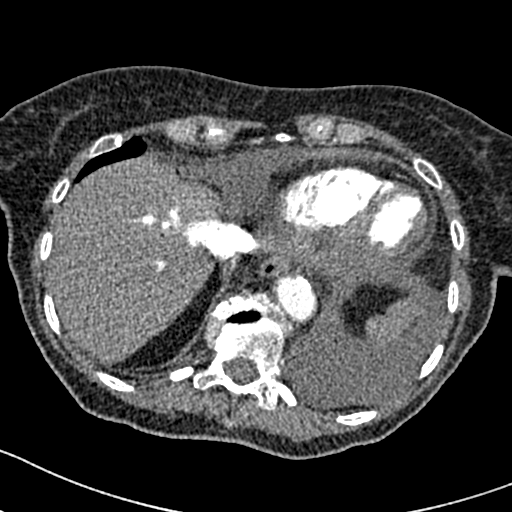
[im 81/257  lung]
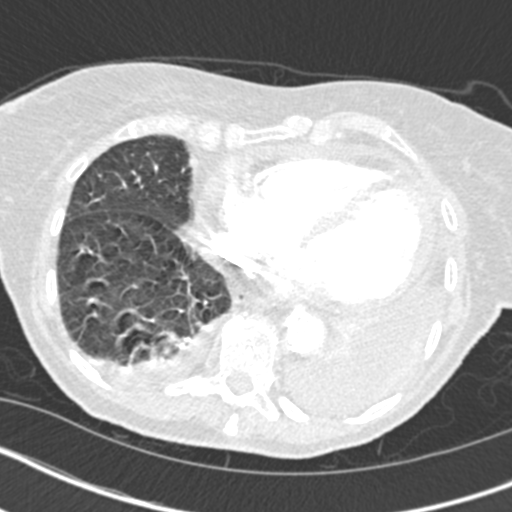
[im 95/257  soft-tissue]
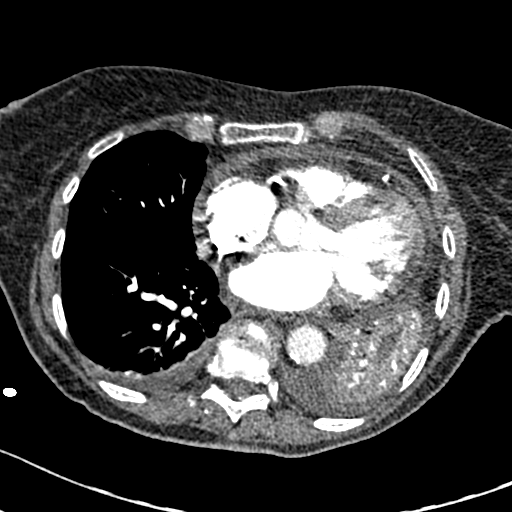
[im 108/257  lung]
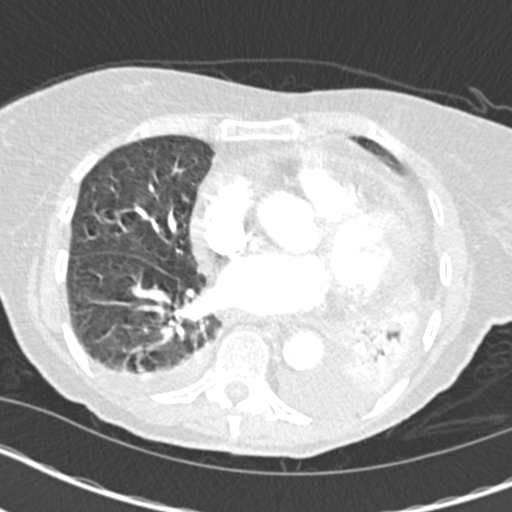
[im 122/257  soft-tissue]
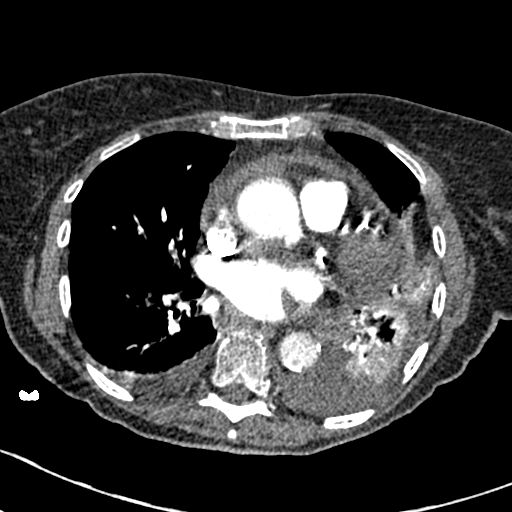
[im 135/257  lung]
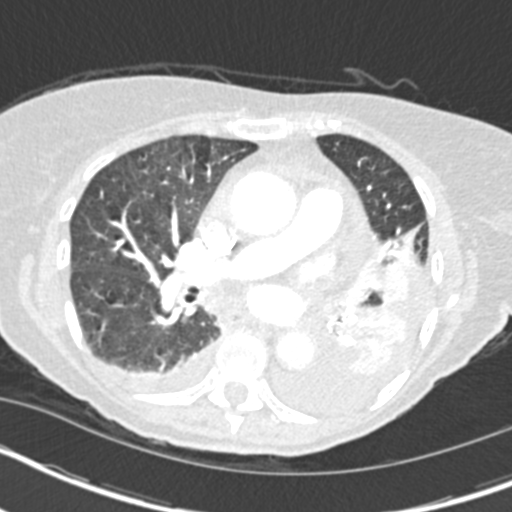
[im 149/257  soft-tissue]
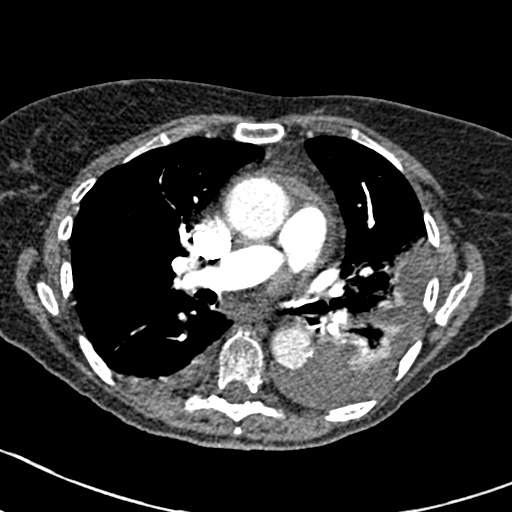
[im 162/257  lung]
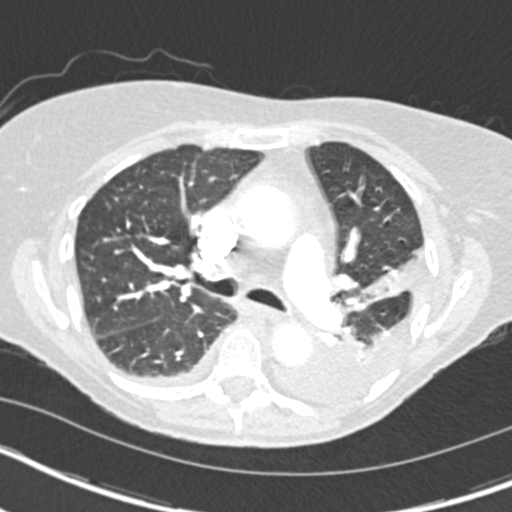
[im 176/257  soft-tissue]
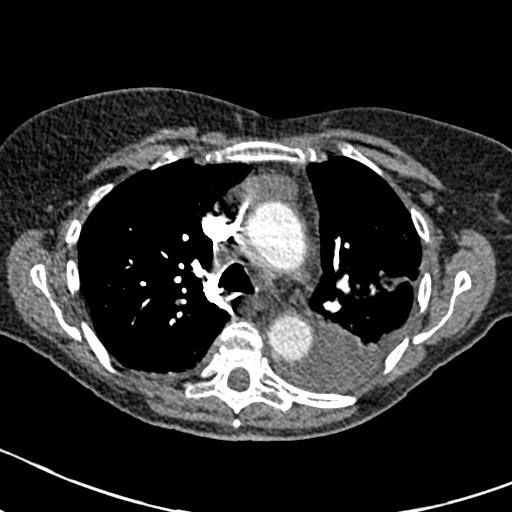
[im 203/257  lung]
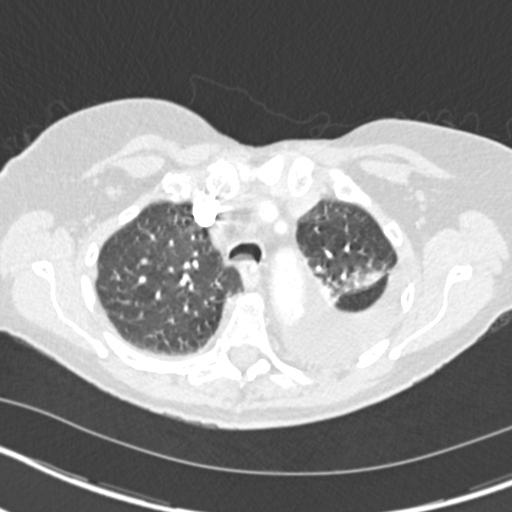
[im 216/257  soft-tissue]
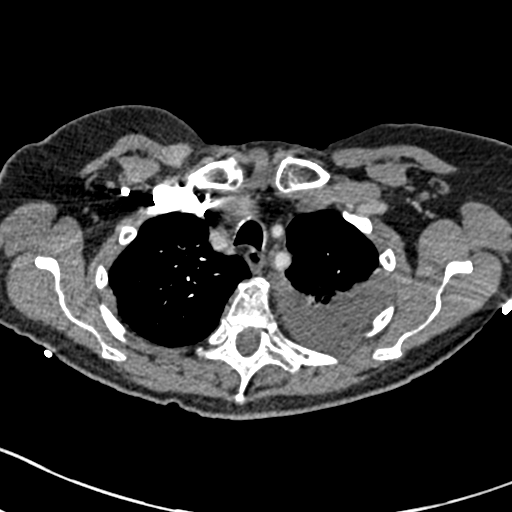
[im 230/257  lung]
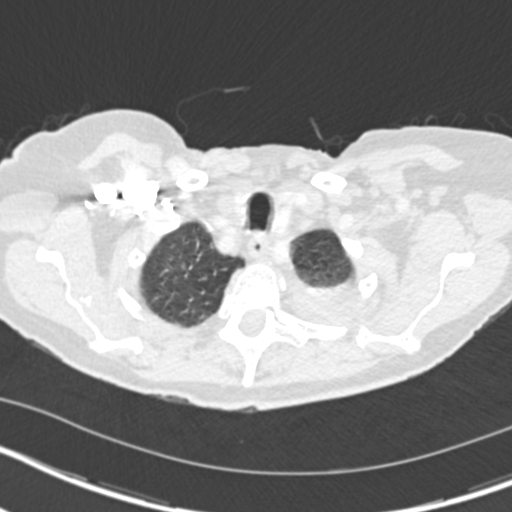
[im 243/257  soft-tissue]
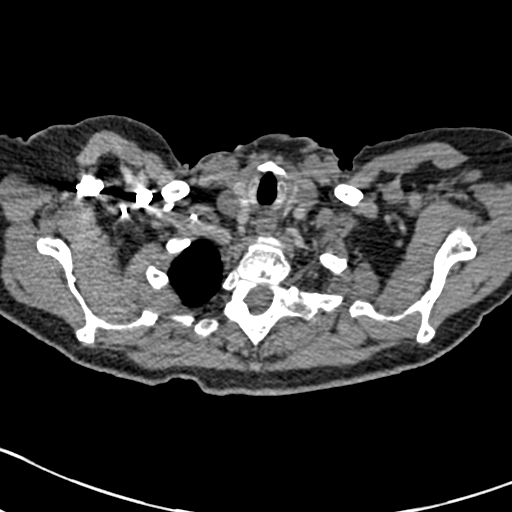

[Series 7: coronal mpr · coronal · 0.52mm/px · 2 of 69 slices shown]
[im 23/69  soft-tissue]
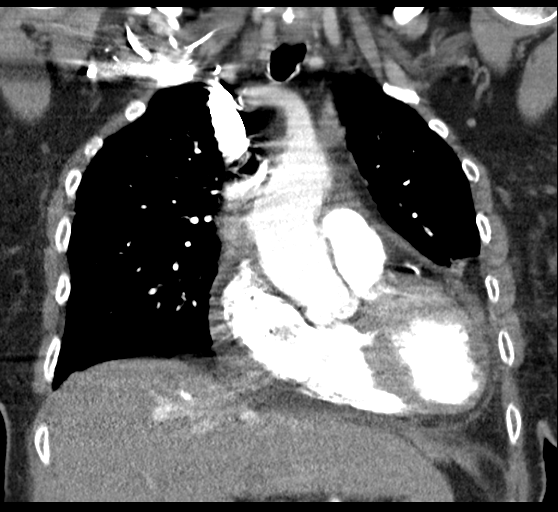
[im 46/69  soft-tissue]
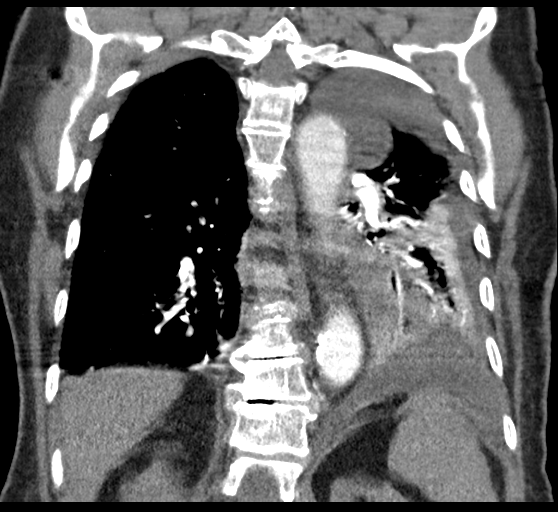

[18 of 46 positions shown; findings below may reference images not displayed]

FINDINGS: Cardiovascular: Contrast injection is sufficient to demonstrate
satisfactory opacification of the pulmonary arteries to the
segmental level. There is a small filling defect within the right
upper lobe posterior segmental pulmonary artery (series 5 image 85,
series 9 image 62). No other pulmonary emboli are identified. The
main pulmonary artery is within normal limits for size. There is no
CT evidence of acute right heart strain. There is atherosclerotic
calcification of the aorta. Coronary artery calcifications are also
noted. There is a normal 3-vessel arch branching pattern. Heart size
is normal. There is a small pericardial effusion, measuring up to 6
mm in thickness.

Mediastinum/Nodes: No mediastinal, hilar or axillary
lymphadenopathy. The visualized thyroid and thoracic esophageal
course are unremarkable.

Lungs/Pleura: There are large left and small right pleural
effusions. There is moderate collapse of the left lower lobe and
platelike atelectasis within the lingula.

Upper Abdomen: Contrast bolus timing is not optimized for evaluation
of the abdominal organs. Within this limitation, the visualized
organs of the upper abdomen are normal.

Musculoskeletal: No chest wall abnormality. No acute or significant
osseous findings.

Review of the MIP images confirms the above findings.
IMPRESSION: 1. Small right upper lobe posterior segmental artery pulmonary
embolus. No evidence of right heart strain.
2. Aortic and coronary artery atherosclerosis.
3. Small pericardial effusion.
4. Large left and small right pleural effusions with associated
atelectasis.
These results will be called to the ordering clinician or
representative by the Radiologist Assistant, and communication
documented in the PACS or zVision Dashboard.

## 2018-09-15 IMAGING — DX DG CHEST 1V
1 series · 1 of 1 positions shown · non-contrast
Comparison: 02/17/2016

CLINICAL DATA: Post left thoracentesis

EXAM:
CHEST 1 VIEW

[chest ap]
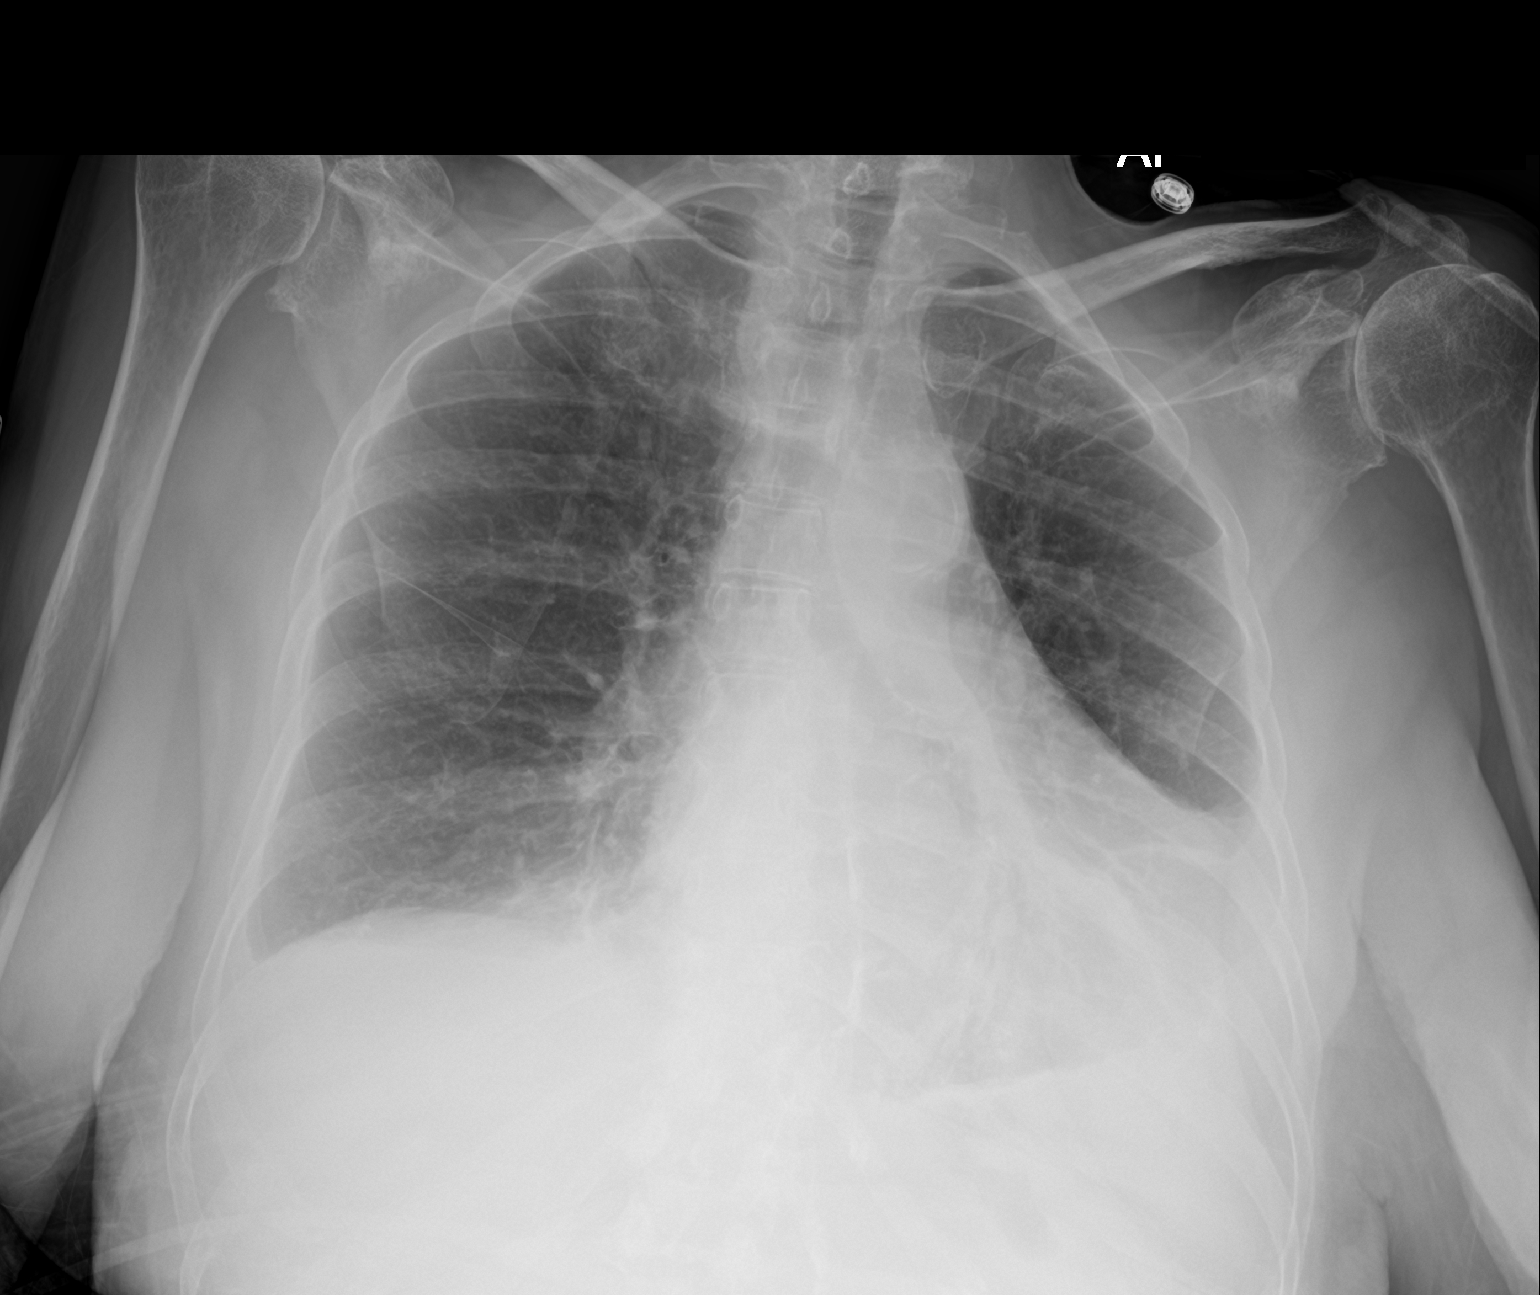

[1 of 1 positions shown; findings below may reference images not displayed]

FINDINGS: Decreased left pleural effusion following thoracentesis. No
pneumothorax. Left lower lobe atelectasis has improved

Mild right lower lobe airspace disease shows progression. Small
right effusion.
IMPRESSION: No pneumothorax post left thoracentesis. Improved aeration in the
left lung base

Progression of right lower lobe airspace disease. Small right
effusion unchanged.

## 2018-10-21 ENCOUNTER — Other Ambulatory Visit: Payer: Self-pay | Admitting: Cardiology

## 2018-10-29 ENCOUNTER — Other Ambulatory Visit: Payer: Self-pay | Admitting: Cardiology

## 2018-11-17 ENCOUNTER — Encounter: Payer: Self-pay | Admitting: Gynecology

## 2018-11-24 ENCOUNTER — Telehealth: Payer: Self-pay

## 2018-11-24 NOTE — Telephone Encounter (Signed)
Spoke with patient and advised her. Appt scheduled. 

## 2018-11-24 NOTE — Telephone Encounter (Signed)
Any vaginal bleeding needs to be evaluated.  I did recommend office visit when she can.

## 2018-11-24 NOTE — Telephone Encounter (Signed)
C/o small amounts of vaginal bleeding on her pad.  Wants to be sure all okay.  What to schedule?

## 2018-11-28 ENCOUNTER — Other Ambulatory Visit: Payer: Self-pay

## 2018-11-29 ENCOUNTER — Encounter: Payer: Self-pay | Admitting: Gynecology

## 2018-11-29 ENCOUNTER — Ambulatory Visit: Payer: Medicare Other | Admitting: Gynecology

## 2018-11-29 VITALS — BP 118/74

## 2018-11-29 DIAGNOSIS — N939 Abnormal uterine and vaginal bleeding, unspecified: Secondary | ICD-10-CM | POA: Diagnosis not present

## 2018-11-29 DIAGNOSIS — Z4689 Encounter for fitting and adjustment of other specified devices: Secondary | ICD-10-CM | POA: Diagnosis not present

## 2018-11-29 NOTE — Patient Instructions (Signed)
Call if any issues after removing the pessary.

## 2018-11-29 NOTE — Progress Notes (Signed)
    Kristen Lewis December 02, 1925 [id number]        85 y.o.  [id number] with history of vaginal vault prolapse following hysterectomy.  Using Gellhorn pessary for support.  Has noticed some light bleeding on and off over the last several months.  No pain associated with this.  Past medical history,surgical history, problem list, medications, allergies, family history and social history were all reviewed and documented in the EPIC chart.  Directed ROS with pertinent positives and negatives documented in the history of present illness/assessment and plan.  Exam: Caryn Bee assistant Vitals:   11/29/18 1004  BP: 118/74   General appearance:  Normal Abdomen soft nontender without mass guarding rebound Pelvic external BUS vagina with atrophic changes.  Gellhorn pessary removed.  Vaginal mucosal irritation above the pessary noted with some blood staining.  No overt erosion but more irritation.  Bimanual without masses or tenderness.  Assessment/Plan:  85 y.o. [id number] with vaginal mucosal irritation with pessary leading to bleeding.  We left the pessary out and will rest the vaginal mucosa for now.  We will see how she does.  If she tolerates this without the pessary then will follow expectantly.  If she finds prolapse is unacceptable she will call and we will try a smaller pessary.  She currently has a 2-1/4 and we will see if we can get something smaller.  The patient agrees with the plan.    Anastasio Auerbach MD, 10:22 AM 11/29/2018

## 2018-12-03 ENCOUNTER — Encounter (INDEPENDENT_AMBULATORY_CARE_PROVIDER_SITE_OTHER): Payer: Self-pay

## 2019-01-18 ENCOUNTER — Telehealth: Payer: Self-pay | Admitting: Cardiology

## 2019-01-18 NOTE — Telephone Encounter (Signed)
Patient called with MD advice to continue current meds. Advised to call back if she becomes symptomatic with low BP - lightheaded, dizzy, weakness, fatigue

## 2019-01-18 NOTE — Telephone Encounter (Signed)
New Message:    Nurse from [place] program called. She wanted Dr Percival Spanish to know pt's blood pressure is running low. Today it  is  97/45, yesterday it was 100/47, Monday it was 111/54. This is her readings before she takes her medicine. Pt have no symptoms. Kristen Lewis wants you to call and evaluate patient please.

## 2019-01-18 NOTE — Telephone Encounter (Signed)
Spoke with patient of Dr. Percival Spanish, per request from [facility] nurse call. She checks BP, pulse, O2 per [facility] program  Patient reports low BP - all taken before meds [facility] 12/7 - 111/54 Tues 12/8 - 100/47 Wed 12/9 - 97/45 She reports HR generally in the 60s.  Patient reports she feels fine - denies lightheadedness, dizziness  She has her BP checked weekly on Tuesday @ [place] In Sept - 118/56, 130/58 In Oct - 118/58 In Not - 128/60, 122/58  She is compliant with meds listed  Will route to MD to review/advise if changes are needed

## 2019-01-18 NOTE — Telephone Encounter (Signed)
Continue current meds 

## 2019-02-04 ENCOUNTER — Other Ambulatory Visit: Payer: Self-pay | Admitting: Cardiology

## 2019-02-06 NOTE — Telephone Encounter (Signed)
8f 64.3kg Creatinine, Serum 1.100 mg/ 10/04/2018 Pt is requesting the 5mg  eliquis but the 2.5 mg is what is on her med list. Bases on dosing criteria the 5mg  that is currently being requested is accurate, however; I still need a pharmd to review and make the appropriate call

## 2019-02-08 ENCOUNTER — Other Ambulatory Visit: Payer: Self-pay | Admitting: Cardiology

## 2019-02-08 MED ORDER — APIXABAN 2.5 MG PO TABS: 2.5000 mg | ORAL_TABLET | Freq: Two times a day (BID) | ORAL | 3 refills | Status: DC

## 2019-02-08 NOTE — Telephone Encounter (Signed)
Per DR Hochrein's last OV  "Pulmonary embolus [facility]) She is on this indefinitely as this was an unprovoked PE.  For maintenance therapy I will reduce her 2.5 mg twice daily."

## 2019-02-08 NOTE — Telephone Encounter (Signed)
*  STAT* If patient is at the pharmacy, call can be transferred to refill team.   1. Which medications need to be refilled? (please list name of each medication and dose if known) apixaban (ELIQUIS) 2.5 MG TABS tablet  2. Which pharmacy/location (including street and city if local pharmacy) is medication to be sent to? [id number] [place], [place] [address]  3. Do they need a 30 day or 90 day supply? 90 day

## 2019-02-20 ENCOUNTER — Other Ambulatory Visit: Payer: Self-pay

## 2019-02-20 MED ORDER — APIXABAN 2.5 MG PO TABS: 2.5000 mg | ORAL_TABLET | Freq: Two times a day (BID) | ORAL | 0 refills | Status: DC

## 2019-02-27 ENCOUNTER — Other Ambulatory Visit: Payer: Self-pay

## 2019-02-27 DIAGNOSIS — Z7901 Long term (current) use of anticoagulants: Secondary | ICD-10-CM

## 2019-02-27 NOTE — Telephone Encounter (Signed)
Called and spoke w/pt regarding needing labs they stated should complete this next week

## 2019-03-01 MED ORDER — APIXABAN 2.5 MG PO TABS: 2.5000 mg | ORAL_TABLET | Freq: Two times a day (BID) | ORAL | 1 refills | Status: DC

## 2019-03-01 NOTE — Telephone Encounter (Signed)
Scr=1.1 on 09/2018 per [facility] Indication PE 2ry prevention

## 2019-03-07 LAB — COMPREHENSIVE METABOLIC PANEL
ALT: 22 IU/L (ref 0–32)
AST: 21 IU/L (ref 0–40)
Albumin/Globulin Ratio: 1.4 (ref 1.2–2.2)
Albumin: 4.2 g/dL (ref 3.5–4.6)
Alkaline Phosphatase: 84 IU/L (ref 39–117)
BUN/Creatinine Ratio: 18 (ref 12–28)
BUN: 20 mg/dL (ref 10–36)
Bilirubin Total: 0.6 mg/dL (ref 0.0–1.2)
CO2: 25 mmol/L (ref 20–29)
Calcium: 9.9 mg/dL (ref 8.7–10.3)
Chloride: 98 mmol/L (ref 96–106)
Creatinine, Ser: 1.12 mg/dL — ABNORMAL HIGH (ref 0.57–1.00)
GFR calc Af Amer: 50 mL/min/{1.73_m2} — ABNORMAL LOW (ref >59)
GFR calc non Af Amer: 43 mL/min/{1.73_m2} — ABNORMAL LOW (ref >59)
Globulin, Total: 2.9 g/dL (ref 1.5–4.5)
Glucose: 102 mg/dL — ABNORMAL HIGH (ref 65–99)
Potassium: 5.1 mmol/L (ref 3.5–5.2)
Sodium: 139 mmol/L (ref 134–144)
Total Protein: 7.1 g/dL (ref 6.0–8.5)

## 2019-04-06 DIAGNOSIS — Z7189 Other specified counseling: Secondary | ICD-10-CM | POA: Insufficient documentation

## 2019-04-06 NOTE — Progress Notes (Signed)
Cardiology Office Note   Date:  04/07/2019   ID:  Kristen Lewis, DOB 1925-05-14, MRN [id number]  PCP:  Haywood Pao, MD  Cardiologist:   Minus Breeding, MD   Chief Complaint  Patient presents with  . Cardiomyopathy      History of Present Illness: Kristen Lewis is a 86 y.o. female who presents for follow up of CAD.  She had inferior MI in 2017.  Cath was done but they were unable to open her RCA and the recommendation was for medical treatment. She was admitted to [facility] hospital in Jan 2018 with HCAP and a PE. She was discharged back to [facility] and then re admitted 03/12/16 with pleuritic chest pain which we felt was from her PE..  We have had her on chronic anticoagulation low dose because this was an unprovoked PE.      Since last saw her she done okay.  She ambulates and walks well.  She uses a walker. The patient denies any new symptoms such as chest discomfort, neck or arm discomfort. There has been no new shortness of breath, PND or orthopnea. There have been no reported palpitations, presyncope or syncope.    Past Medical History:  Diagnosis Date  . Arthritis   . CAD (coronary artery disease)    PCI to her RCA in Nov 2017 in [place] in setting of inferior STEMI. Residual 80% LAD, 70% CFX, 90% OM, EF 50%  . Cardiomyopathy ([place])    EF 45%  . Elevated cholesterol   . Hearing deficit   . Hypertension   . Vaginal pessary present     Past Surgical History:  Procedure Laterality Date  . CARDIAC CATHETERIZATION    . CHOLECYSTECTOMY    . VAGINAL HYSTERECTOMY     Leiomyomata/bleeding     Current Outpatient Medications  Medication Sig Dispense Refill  . apixaban (ELIQUIS) 2.5 MG TABS tablet Take 1 tablet (2.5 mg total) by mouth 2 (two) times daily. LABS NEEDED FOR FURTHER REFILLS 60 tablet 1  . atorvastatin (LIPITOR) 20 MG tablet TAKE 1 TABLET BY MOUTH  DAILY 90 tablet 3  . Calcium-Magnesium-Vitamin D (CALCIUM 500 PO) Take 1 tablet by mouth daily.    .  carvedilol (COREG) 3.125 MG tablet TAKE 1 TABLET BY MOUTH  TWICE A DAY WITH MEALS 180 tablet 3  . cholecalciferol (VITAMIN D) 1000 UNITS tablet Take 1,000 Units by mouth every morning.     . furosemide (LASIX) 40 MG tablet TAKE 1 TABLET BY MOUTH  DAILY 90 tablet 2  . lisinopril (PRINIVIL,ZESTRIL) 20 MG tablet Take 20 mg by mouth daily.    . Magnesium 250 MG TABS Take 1 tablet by mouth daily.    . Multiple Vitamins-Minerals ([facility] SPECTRAVITE ADULT 50+) TABS Take 1 tablet by mouth daily.    Marland Kitchen PARoxetine (PAXIL) 20 MG tablet Take 20 mg by mouth daily.     . nitroGLYCERIN (NITROSTAT) 0.4 MG SL tablet Place 1 tablet (0.4 mg total) under the tongue every 5 (five) minutes as needed for chest pain. 25 tablet 2   No current facility-administered medications for this visit.    Allergies:   Patient has no known allergies.    ROS:  Please see the history of present illness.   Otherwise, review of systems are positive for none.   All other systems are reviewed and negative.    PHYSICAL EXAM: VS:  BP 116/64   Pulse 77   Ht 5\' 1"  (1.549  m)   Wt 138 lb 9.6 oz (62.9 kg)   SpO2 97%   BMI 26.19 kg/m  , BMI Body mass index is 26.19 kg/m.  GENERAL:  Well appearing NECK:  No jugular venous distention, waveform within normal limits, carotid upstroke brisk and symmetric, no bruits, no thyromegaly LUNGS:  Clear to auscultation bilaterally CHEST:  Unremarkable HEART:  PMI not displaced or sustained,S1 and S2 within normal limits, no S3, no S4, no clicks, no rubs, no murmurs ABD:  Flat, positive bowel sounds normal in frequency in pitch, no bruits, no rebound, no guarding, no midline pulsatile mass, no hepatomegaly, no splenomegaly EXT:  2 plus pulses throughout, no edema, no cyanosis no clubbing   EKG:  EKG is  done ordered today. Sinus rhythm, rate 69, LAD, intervals within normal limits, probable old lateral MI.     Recent Labs: 03/07/2019: ALT 22; BUN 20; Creatinine, Ser 1.12; Potassium 5.1; Sodium  139    Lipid Panel No results found for: CHOL, TRIG, HDL, CHOLHDL, VLDL, LDLCALC, LDLDIRECT    Wt Readings from Last 3 Encounters:  04/07/19 138 lb 9.6 oz (62.9 kg)  03/29/18 141 lb 12.8 oz (64.3 kg)  09/22/17 139 lb (63 kg)      Other studies Reviewed: Additional studies/ records that were reviewed today include: Labs Review of the above records demonstrates:  See elsewhere   ASSESSMENT AND PLAN:   CAD S/P unsuccessful PCI Nov 2017 The patient has no new symptoms.  No change in therapy.  I reviewed with her the catheterization results from her previous cardiologist.  The distal circumflex was the vessel that had highest grade stenosis and this will be managed medically.  Ischemic cardiomyopathy EF was 45%.  I will repeat an echocardiogram as its been greater than 3 years since that was last evaluated.  For now she will continue the meds as listed.  Pulmonary embolus (HCC) I agree that this was unprovoked.  He is on maintenance therapy.  No change.  Dyslipidemia LDL 65 with an HDL of 34.  No change in therapy.  Covid education She has had both doses of her vaccine.  Current medicines are reviewed at length with the patient today.  The patient does not have concerns regarding medicines.  The following changes have been made: None  Labs/ tests ordered today include:    Orders Placed This Encounter  Procedures  . EKG 12-Lead  . ECHOCARDIOGRAM COMPLETE     Disposition:   FU with me in 12 months.     Signed, Rollene Rotunda, MD  04/07/2019 10:42 AM    [facility]

## 2019-04-07 ENCOUNTER — Encounter (INDEPENDENT_AMBULATORY_CARE_PROVIDER_SITE_OTHER): Payer: Self-pay

## 2019-04-07 ENCOUNTER — Ambulatory Visit: Payer: Medicare PPO | Admitting: Cardiology

## 2019-04-07 ENCOUNTER — Other Ambulatory Visit: Payer: Self-pay

## 2019-04-07 ENCOUNTER — Encounter: Payer: Self-pay | Admitting: Cardiology

## 2019-04-07 VITALS — BP 116/64 | HR 77 | Ht 61.0 in | Wt 138.6 lb

## 2019-04-07 DIAGNOSIS — I2782 Chronic pulmonary embolism: Secondary | ICD-10-CM

## 2019-04-07 DIAGNOSIS — Z7189 Other specified counseling: Secondary | ICD-10-CM

## 2019-04-07 DIAGNOSIS — I255 Ischemic cardiomyopathy: Secondary | ICD-10-CM

## 2019-04-07 DIAGNOSIS — E785 Hyperlipidemia, unspecified: Secondary | ICD-10-CM | POA: Diagnosis not present

## 2019-04-07 DIAGNOSIS — I2510 Atherosclerotic heart disease of native coronary artery without angina pectoris: Secondary | ICD-10-CM

## 2019-04-07 NOTE — Patient Instructions (Signed)
Medication Instructions:  No Changes *If you need a refill on your cardiac medications before your next appointment, please call your pharmacy*  Lab Work: None  Testing/Procedures: Your physician has requested that you have an echocardiogram. Echocardiography is a painless test that uses sound waves to create images of your heart. It provides your doctor with information about the size and shape of your heart and how well your heart's chambers and valves are working. This procedure takes approximately one hour. There are no restrictions for this procedure. [address]  Follow-Up: At [facility], you and your health needs are our priority.  As part of our continuing mission to provide you with exceptional heart care, we have created designated Provider Care Teams.  These Care Teams include your primary Cardiologist (physician) and Advanced Practice Providers (APPs -  Physician Assistants and Nurse Practitioners) who all work together to provide you with the care you need, when you need it.  We recommend signing up for the patient portal called "MyChart".  Sign up information is provided on this After Visit Summary.  MyChart is used to connect with patients for Virtual Visits (Telemedicine).  Patients are able to view lab/test results, encounter notes, upcoming appointments, etc.  Non-urgent messages can be sent to your provider as well.   To learn more about what you can do with MyChart, go to [email or web address].    Your next appointment:   1 year(s)  You will receive a reminder letter in the mail two months in advance. If you don't receive a letter, please call our office to schedule the follow-up appointment.  The format for your next appointment:   In Person  Provider:  Rollene Rotunda, MD

## 2019-04-25 ENCOUNTER — Other Ambulatory Visit: Payer: Self-pay

## 2019-04-25 ENCOUNTER — Ambulatory Visit (HOSPITAL_COMMUNITY): Payer: Medicare PPO | Attending: Cardiology

## 2019-04-25 DIAGNOSIS — I255 Ischemic cardiomyopathy: Secondary | ICD-10-CM

## 2019-05-01 MED ORDER — CARVEDILOL 6.25 MG PO TABS: ORAL_TABLET | ORAL | 3 refills | Status: DC

## 2019-05-01 NOTE — Addendum Note (Signed)
Addended by: Teressa Senter on: 05/01/2019 01:49 PM   Modules accepted: Orders

## 2019-05-31 ENCOUNTER — Other Ambulatory Visit: Payer: Self-pay | Admitting: Cardiology

## 2019-06-09 ENCOUNTER — Other Ambulatory Visit: Payer: Self-pay | Admitting: Cardiology

## 2019-06-09 NOTE — Telephone Encounter (Signed)
Sorry, did you mean to send this to me? It's a Dr. Antoine Poche patient. But I do not think the provider is likely to be able to answer the question. Have you called the patient and asked if they need a refill?

## 2019-07-01 NOTE — Progress Notes (Signed)
Cardiology Office Note   Date:  07/03/2019   ID:  Kristen Lewis, DOB 11-19-1925, MRN [id number]  PCP:  Haywood Pao, MD  Cardiologist:   Minus Breeding, MD   Chief Complaint  Patient presents with  . Cardiomyopathy      History of Present Illness: Kristen Lewis is a 86 y.o. female who presents for follow up of CAD.  She had inferior MI in 2017.  Cath was done but they were unable to open her RCA and the recommendation was for medical treatment. She was admitted to [facility] hospital in Jan 2018 with HCAP and a PE. She was discharged back to [facility] and then re admitted 03/12/16 with pleuritic chest pain which we felt was from her PE.  We have had her on chronic anticoagulation low dose because this was an unprovoked PE.      Since last saw her she has done okay.  She had one episode of feeling lightheaded.  She otherwise does well.  She has been going to the grocery store a little bit.  She walked behind a cart.  Sometimes she uses a walker but she does pretty well.  The patient denies any new symptoms such as chest discomfort, neck or arm discomfort. There has been no new shortness of breath, PND or orthopnea. There have been no reported palpitations, presyncope or syncope.   Past Medical History:  Diagnosis Date  . Arthritis   . CAD (coronary artery disease)    PCI to her RCA in Nov 2017 in [place] in setting of inferior STEMI. Residual 80% LAD, 70% CFX, 90% OM, EF 50%  . Cardiomyopathy ([place])    EF 45%  . Elevated cholesterol   . Hearing deficit   . Hypertension   . Vaginal pessary present     Past Surgical History:  Procedure Laterality Date  . CARDIAC CATHETERIZATION    . CHOLECYSTECTOMY    . VAGINAL HYSTERECTOMY     Leiomyomata/bleeding     Current Outpatient Medications  Medication Sig Dispense Refill  . apixaban (ELIQUIS) 2.5 MG TABS tablet Take 1 tablet (2.5 mg total) by mouth 2 (two) times daily. 60 tablet 1  . atorvastatin (LIPITOR) 20 MG tablet TAKE  1 TABLET BY MOUTH  DAILY 90 tablet 3  . Calcium-Magnesium-Vitamin D (CALCIUM 500 PO) Take 1 tablet by mouth daily.    . carvedilol (COREG) 6.25 MG tablet TAKE 1 TABLET BY MOUTH  TWICE A DAY WITH MEALS 180 tablet 3  . cholecalciferol (VITAMIN D) 1000 UNITS tablet Take 1,000 Units by mouth every morning.     . furosemide (LASIX) 40 MG tablet TAKE 1 TABLET BY MOUTH  DAILY 90 tablet 2  . lisinopril (PRINIVIL,ZESTRIL) 20 MG tablet Take 20 mg by mouth daily.    . Magnesium 250 MG TABS Take 1 tablet by mouth daily.    . Multiple Vitamins-Minerals ([employer]S SPECTRAVITE ADULT 50+) TABS Take 1 tablet by mouth daily.    Marland Kitchen PARoxetine (PAXIL) 20 MG tablet Take 20 mg by mouth daily.     . nitroGLYCERIN (NITROSTAT) 0.4 MG SL tablet Place 1 tablet (0.4 mg total) under the tongue every 5 (five) minutes as needed for chest pain. 25 tablet 2   No current facility-administered medications for this visit.    Allergies:   Patient has no known allergies.    ROS:  Please see the history of present illness.   Otherwise, review of systems are positive for none.  All other systems are reviewed and negative.    PHYSICAL EXAM: VS:  BP (!) 120/58   Pulse (!) 56   Ht 5\' 1"  (1.549 m)   Wt 136 lb 9.6 oz (62 kg)   SpO2 98%   BMI 25.81 kg/m  , BMI Body mass index is 25.81 kg/m.  GENERAL:  Well appearing NECK:  No jugular venous distention, waveform within normal limits, carotid upstroke brisk and symmetric, no bruits, no thyromegaly LUNGS:  Clear to auscultation bilaterally CHEST:  Unremarkable HEART:  PMI not displaced or sustained,S1 and S2 within normal limits, no S3, no S4, no clicks, no rubs, no murmurs ABD:  Flat, positive bowel sounds normal in frequency in pitch, no bruits, no rebound, no guarding, no midline pulsatile mass, no hepatomegaly, no splenomegaly EXT:  2 plus pulses throughout, no edema, no cyanosis no clubbing   EKG:  EKG is not done ordered today.  Recent Labs: 03/07/2019: ALT 22; BUN 20;  Creatinine, Ser 1.12; Potassium 5.1; Sodium 139    Lipid Panel No results found for: CHOL, TRIG, HDL, CHOLHDL, VLDL, LDLCALC, LDLDIRECT    Wt Readings from Last 3 Encounters:  07/03/19 136 lb 9.6 oz (62 kg)  04/07/19 138 lb 9.6 oz (62.9 kg)  03/29/18 141 lb 12.8 oz (64.3 kg)      Other studies Reviewed: Additional studies/ records that were reviewed today include: Labs Review of the above records demonstrates:  NA   ASSESSMENT AND PLAN:   CAD S/P unsuccessful PCI Nov 2017 The patient has no new sypmtoms.  No further cardiovascular testing is indicated.  We will continue with aggressive risk reduction and meds as listed.  Ischemic cardiomyopathy This EF in March was mildly low but not significantly different than previous.  I did titrate the beta-blocker after I saw this.  However, I reviewed the blood pressure today and somewhat labile with a heart rate of 59.  I would not want to titrate further.  She is on a reasonable medical regimen.  She is not having any overt shortness of breath.  No change in therapy.   Pulmonary embolus (HCC) I agree that this was unprovoked.  She is tolerating anticoagulation.  No change in therapy.   Dyslipidemia LDL was 65 with an HDL 34.  Continue the meds as listed.  65 with an HDL of 34.  No change in therapy.  Covid education She has had her vaccine.  Current medicines are reviewed at length with the patient today.  The patient does not have concerns regarding medicines.  The following changes have been made: None  Labs/ tests ordered today include:    No orders of the defined types were placed in this encounter.    Disposition:   FU with me in 74months.     Signed, 11month, MD  07/03/2019 11:44 AM     [facility]

## 2019-07-03 ENCOUNTER — Encounter (INDEPENDENT_AMBULATORY_CARE_PROVIDER_SITE_OTHER): Payer: Self-pay

## 2019-07-03 ENCOUNTER — Encounter: Payer: Self-pay | Admitting: Cardiology

## 2019-07-03 ENCOUNTER — Other Ambulatory Visit: Payer: Self-pay

## 2019-07-03 ENCOUNTER — Ambulatory Visit: Payer: Medicare PPO | Admitting: Cardiology

## 2019-07-03 VITALS — BP 120/58 | HR 56 | Ht 61.0 in | Wt 136.6 lb

## 2019-07-03 DIAGNOSIS — I2699 Other pulmonary embolism without acute cor pulmonale: Secondary | ICD-10-CM

## 2019-07-03 DIAGNOSIS — I255 Ischemic cardiomyopathy: Secondary | ICD-10-CM | POA: Diagnosis not present

## 2019-07-03 DIAGNOSIS — I2510 Atherosclerotic heart disease of native coronary artery without angina pectoris: Secondary | ICD-10-CM

## 2019-07-03 DIAGNOSIS — E785 Hyperlipidemia, unspecified: Secondary | ICD-10-CM

## 2019-07-03 DIAGNOSIS — Z7189 Other specified counseling: Secondary | ICD-10-CM

## 2019-07-03 NOTE — Patient Instructions (Signed)
Medication Instructions:  °NO CHANGES °*If you need a refill on your cardiac medications before your next appointment, please call your pharmacy* ° °Lab Work: °NONE ORDERED THIS VISIT ° °Testing/Procedures: °NONE ORDERED THIS VISIT ° °Follow-Up: °At [facility], you and your health needs are our priority.  As part of our continuing mission to provide you with exceptional heart care, we have created designated Provider Care Teams.  These Care Teams include your primary Cardiologist (physician) and Advanced Practice Providers (APPs -  Physician Assistants and Nurse Practitioners) who all work together to provide you with the care you need, when you need it. ° °Your next appointment:   °6 month(s)  You will receive a reminder letter in the mail two months in advance. If you don't receive a letter, please call our office to schedule the follow-up appointment. ° °The format for your next appointment:   °In Person ° °Provider:   °James Hochrein, MD ° ° °

## 2019-08-03 DIAGNOSIS — Z1231 Encounter for screening mammogram for malignant neoplasm of breast: Secondary | ICD-10-CM | POA: Diagnosis not present

## 2019-08-25 ENCOUNTER — Telehealth: Payer: Self-pay | Admitting: Cardiology

## 2019-08-25 NOTE — Telephone Encounter (Signed)
Please continue all medication as prescribed and daily BP monitoring. If systolic BP drop below 100, please decrease lisinopril dose by half and call back to schedule f/u appointment.

## 2019-08-25 NOTE — Telephone Encounter (Signed)
Agree 

## 2019-08-25 NOTE — Telephone Encounter (Signed)
Called and spoke with pt, reviewed RPH's recommendations for her BP. Notified that if the top number of her BP drops below  100 to cut her lisinopril dose in half and call our office to schedule a follow up appt. Notified that as long as the top number of her BP is 100 or greater she is fine to continue her medications a prescribed. Pt thankful for the call and feels relieved. Notified if she has any other issues we have a provider on call for the weekends she can contact. Pt verbalized understanding with all instructions and had no other questions at this time.

## 2019-08-25 NOTE — Telephone Encounter (Signed)
New message   Pt c/o BP issue: STAT if pt c/o blurred vision, one-sided weakness or slurred speech  1. What are your last 5 BP readings? 105/55 hr 66 122/64  110/56   2. Are you having any other symptoms (ex. Dizziness, headache, blurred vision, passed out)? No   3. What is your BP issue? Patient states that her b/p is low.

## 2019-08-25 NOTE — Telephone Encounter (Signed)
Called and spoke with pt she reports that she lives at [facility] and a nurse there checks her BP. She reports that on 08/15/19 her BP was 122/64 without taking her meds  On 08/22/19 her BP was 110/56 with taking her meds And today it was 105/55 The nurse there was concerned with the continued drop. Pt reports she is not having any symptoms or issues.  Notified I would send this message to Dr.Hochrein and our pharmacist and if they had any suggestions we would let her know. Pt agreeable and verbalized understanding. No other questions at this time.

## 2019-09-11 ENCOUNTER — Other Ambulatory Visit: Payer: Self-pay | Admitting: Cardiology

## 2019-09-11 NOTE — Telephone Encounter (Signed)
*  STAT* If patient is at the pharmacy, call can be transferred to refill team.   1. Which medications need to be refilled? (please list name of each medication and dose if known) apixaban (ELIQUIS) 2.5 MG TABS tablet  2. Which pharmacy/location (including street and city if local pharmacy) is medication to be sent to? [id number] - [place], [place] - [address]  3. Do they need a 30 day or 90 day supply? 30 day supply  Patient states she has a 2 day supply remaining.

## 2019-09-13 MED ORDER — APIXABAN 2.5 MG PO TABS: 2.5000 mg | ORAL_TABLET | Freq: Two times a day (BID) | ORAL | 5 refills | Status: DC

## 2019-09-13 NOTE — Telephone Encounter (Signed)
Prescription refill request for Eliquis received. Indication: PE Last office visit: 07/03/2019  Hochrein Scr: 1.12 03/07/2019 Age: 86 Weight:  62 kg  Prescription refilled

## 2019-09-25 ENCOUNTER — Other Ambulatory Visit: Payer: Self-pay | Admitting: Cardiology

## 2019-09-26 ENCOUNTER — Other Ambulatory Visit: Payer: Self-pay | Admitting: Cardiology

## 2019-09-26 NOTE — Telephone Encounter (Signed)
60f 62kg Scr 1.12 03/07/19 Lovw/hochrein 07/03/19 Pt request 2.5mg  eliquis when qualifies for 5mg  will route to pharmd pool for dosage determination

## 2019-09-27 NOTE — Telephone Encounter (Signed)
Indication is prevention of recurrent VTE, not afib. Dosing is correct.

## 2019-10-04 DIAGNOSIS — E7800 Pure hypercholesterolemia, unspecified: Secondary | ICD-10-CM | POA: Diagnosis not present

## 2019-10-04 DIAGNOSIS — M810 Age-related osteoporosis without current pathological fracture: Secondary | ICD-10-CM | POA: Diagnosis not present

## 2019-10-11 DIAGNOSIS — I2699 Other pulmonary embolism without acute cor pulmonale: Secondary | ICD-10-CM | POA: Diagnosis not present

## 2019-10-11 DIAGNOSIS — R82998 Other abnormal findings in urine: Secondary | ICD-10-CM | POA: Diagnosis not present

## 2019-10-11 DIAGNOSIS — I509 Heart failure, unspecified: Secondary | ICD-10-CM | POA: Diagnosis not present

## 2019-10-11 DIAGNOSIS — Z7901 Long term (current) use of anticoagulants: Secondary | ICD-10-CM | POA: Diagnosis not present

## 2019-10-11 DIAGNOSIS — I255 Ischemic cardiomyopathy: Secondary | ICD-10-CM | POA: Diagnosis not present

## 2019-10-11 DIAGNOSIS — Z0000 Encounter for general adult medical examination without abnormal findings: Secondary | ICD-10-CM | POA: Diagnosis not present

## 2019-10-11 DIAGNOSIS — I130 Hypertensive heart and chronic kidney disease with heart failure and stage 1 through stage 4 chronic kidney disease, or unspecified chronic kidney disease: Secondary | ICD-10-CM | POA: Diagnosis not present

## 2019-10-11 DIAGNOSIS — I2510 Atherosclerotic heart disease of native coronary artery without angina pectoris: Secondary | ICD-10-CM | POA: Diagnosis not present

## 2019-10-11 DIAGNOSIS — D6869 Other thrombophilia: Secondary | ICD-10-CM | POA: Diagnosis not present

## 2019-10-11 DIAGNOSIS — E7800 Pure hypercholesterolemia, unspecified: Secondary | ICD-10-CM | POA: Diagnosis not present

## 2019-11-03 DIAGNOSIS — Z1212 Encounter for screening for malignant neoplasm of rectum: Secondary | ICD-10-CM | POA: Diagnosis not present

## 2019-11-23 DIAGNOSIS — H52203 Unspecified astigmatism, bilateral: Secondary | ICD-10-CM | POA: Diagnosis not present

## 2019-11-23 DIAGNOSIS — D23112 Other benign neoplasm of skin of right lower eyelid, including canthus: Secondary | ICD-10-CM | POA: Diagnosis not present

## 2019-11-23 DIAGNOSIS — H43813 Vitreous degeneration, bilateral: Secondary | ICD-10-CM | POA: Diagnosis not present

## 2019-11-23 DIAGNOSIS — D23122 Other benign neoplasm of skin of left lower eyelid, including canthus: Secondary | ICD-10-CM | POA: Diagnosis not present

## 2019-12-25 ENCOUNTER — Other Ambulatory Visit: Payer: Self-pay | Admitting: Cardiology

## 2020-04-09 DIAGNOSIS — F411 Generalized anxiety disorder: Secondary | ICD-10-CM | POA: Diagnosis not present

## 2020-04-09 DIAGNOSIS — I10 Essential (primary) hypertension: Secondary | ICD-10-CM | POA: Diagnosis not present

## 2020-04-09 DIAGNOSIS — I252 Old myocardial infarction: Secondary | ICD-10-CM | POA: Diagnosis not present

## 2020-04-09 DIAGNOSIS — R32 Unspecified urinary incontinence: Secondary | ICD-10-CM | POA: Diagnosis not present

## 2020-04-09 DIAGNOSIS — Z7901 Long term (current) use of anticoagulants: Secondary | ICD-10-CM | POA: Diagnosis not present

## 2020-04-09 DIAGNOSIS — Z809 Family history of malignant neoplasm, unspecified: Secondary | ICD-10-CM | POA: Diagnosis not present

## 2020-04-09 DIAGNOSIS — I2510 Atherosclerotic heart disease of native coronary artery without angina pectoris: Secondary | ICD-10-CM | POA: Diagnosis not present

## 2020-04-09 DIAGNOSIS — E785 Hyperlipidemia, unspecified: Secondary | ICD-10-CM | POA: Diagnosis not present

## 2020-04-09 DIAGNOSIS — Z8249 Family history of ischemic heart disease and other diseases of the circulatory system: Secondary | ICD-10-CM | POA: Diagnosis not present

## 2020-04-12 DIAGNOSIS — I509 Heart failure, unspecified: Secondary | ICD-10-CM | POA: Diagnosis not present

## 2020-04-12 DIAGNOSIS — Z7901 Long term (current) use of anticoagulants: Secondary | ICD-10-CM | POA: Diagnosis not present

## 2020-04-12 DIAGNOSIS — I2699 Other pulmonary embolism without acute cor pulmonale: Secondary | ICD-10-CM | POA: Diagnosis not present

## 2020-04-12 DIAGNOSIS — I2510 Atherosclerotic heart disease of native coronary artery without angina pectoris: Secondary | ICD-10-CM | POA: Diagnosis not present

## 2020-04-12 DIAGNOSIS — D6869 Other thrombophilia: Secondary | ICD-10-CM | POA: Diagnosis not present

## 2020-04-12 DIAGNOSIS — E7800 Pure hypercholesterolemia, unspecified: Secondary | ICD-10-CM | POA: Diagnosis not present

## 2020-04-12 DIAGNOSIS — N1831 Chronic kidney disease, stage 3a: Secondary | ICD-10-CM | POA: Diagnosis not present

## 2020-04-12 DIAGNOSIS — I255 Ischemic cardiomyopathy: Secondary | ICD-10-CM | POA: Diagnosis not present

## 2020-04-12 DIAGNOSIS — I130 Hypertensive heart and chronic kidney disease with heart failure and stage 1 through stage 4 chronic kidney disease, or unspecified chronic kidney disease: Secondary | ICD-10-CM | POA: Diagnosis not present

## 2020-04-22 NOTE — Progress Notes (Signed)
Cardiology Office Note   Date:  04/23/2020   ID:  Kristen Lewis, DOB 1926/02/08, MRN [id number]  PCP:  Gaspar Garbe, MD  Cardiologist:   Rollene Rotunda, MD   Chief Complaint  Patient presents with  . Coronary Artery Disease      History of Present Illness: Kristen Lewis is a 87 y.o. female who presents for follow up of CAD.  She had inferior MI in 2017.  Cath was done but they were unable to open her RCA and the recommendation was for medical treatment. She was admitted to [facility] hospital in Jan 2018 with HCAP and a PE. She was discharged back to [facility] and then re admitted 03/12/16 with pleuritic chest pain which we felt was from her PE.  We have had her on chronic anticoagulation low dose because this was an unprovoked PE.      Since I last saw her she called with some low BPs.  However, these have not been bothering her recently.  She has had them checked by the nurses at [facility].  She has not had any presyncope or syncope.  She denies any chest pressure, neck or arm discomfort.  She has had no palpitations.  She denies any shortness of breath, PND or orthopnea.  She walks briskly to the dining hall with her walker twice daily.  Her friends call her "speedy."     Past Medical History:  Diagnosis Date  . Arthritis   . CAD (coronary artery disease)    PCI to her RCA in Nov 2017 in [place] in setting of inferior STEMI. Residual 80% LAD, 70% CFX, 90% OM, EF 50%  . Cardiomyopathy (HCC)    EF 45%  . Elevated cholesterol   . Hearing deficit   . Hypertension   . Vaginal pessary present     Past Surgical History:  Procedure Laterality Date  . CARDIAC CATHETERIZATION    . CHOLECYSTECTOMY    . VAGINAL HYSTERECTOMY     Leiomyomata/bleeding     Current Outpatient Medications  Medication Sig Dispense Refill  . atorvastatin (LIPITOR) 20 MG tablet TAKE 1 TABLET BY MOUTH  DAILY 90 tablet 3  . Calcium-Magnesium-Vitamin D (CALCIUM 500 PO) Take 1 tablet by mouth  daily.    . carvedilol (COREG) 6.25 MG tablet TAKE 1 TABLET BY MOUTH  TWICE A DAY WITH MEALS 180 tablet 3  . ELIQUIS 2.5 MG TABS tablet TAKE 1 TABLET TWICE DAILY 180 tablet 1  . furosemide (LASIX) 40 MG tablet TAKE 1 TABLET BY MOUTH  DAILY 90 tablet 2  . Magnesium 250 MG TABS Take 1 tablet by mouth daily.    . Multiple Vitamins-Minerals (CVS SPECTRAVITE ADULT 50+) TABS Take 1 tablet by mouth daily.    . nitroGLYCERIN (NITROSTAT) 0.4 MG SL tablet Place 1 tablet (0.4 mg total) under the tongue every 5 (five) minutes as needed for chest pain. 25 tablet 2  . PARoxetine (PAXIL) 20 MG tablet Take 20 mg by mouth daily.     Marland Kitchen triamcinolone (KENALOG) 0.1 % daily.    . zaleplon (SONATA) 5 MG capsule 1 tablet po qhs prn for insomnia    . cholecalciferol (VITAMIN D) 1000 UNITS tablet Take 1,000 Units by mouth every morning.     . sacubitril-valsartan (ENTRESTO) 24-26 MG Take 1 tablet by mouth 2 (two) times daily. 60 tablet 0   No current facility-administered medications for this visit.    Allergies:   Patient has no  known allergies.    ROS:  Please see the history of present illness.   Otherwise, review of systems are positive for none.   All other systems are reviewed and negative.    PHYSICAL EXAM: VS:  BP 122/64 (BP Location: Left Arm, Patient Position: Sitting)   Pulse 70   Ht 5' 1.5" (1.562 m)   Wt 135 lb (61.2 kg)   SpO2 94%   BMI 25.09 kg/m  , BMI Body mass index is 25.09 kg/m.  GENERAL:  Well appearing NECK:  No jugular venous distention, waveform within normal limits, carotid upstroke brisk and symmetric, no bruits, no thyromegaly LUNGS:  Clear to auscultation bilaterally CHEST:  Unremarkable HEART:  PMI not displaced or sustained,S1 and S2 within normal limits, no S3, no S4, no clicks, no rubs, 2 out of 6 brief apical systolic murmur radiating slightly at the aortic outflow tract, no diastolic murmurs ABD:  Flat, positive bowel sounds normal in frequency in pitch, no bruits, no  rebound, no guarding, no midline pulsatile mass, no hepatomegaly, no splenomegaly EXT:  2 plus pulses throughout, no edema, no cyanosis no clubbing    EKG:  EKG is  done ordered today. Sinus rhythm, rate 7, axis left axis deviation, poor anterior R wave progression, possible old anteroseptal infarct, no acute ST-T wave changes.  Recent Labs: No results found for requested labs within last 8760 hours.    Lipid Panel No results found for: CHOL, TRIG, HDL, CHOLHDL, VLDL, LDLCALC, LDLDIRECT    Wt Readings from Last 3 Encounters:  04/23/20 135 lb (61.2 kg)  07/03/19 136 lb 9.6 oz (62 kg)  04/07/19 138 lb 9.6 oz (62.9 kg)      Other studies Reviewed: Additional studies/ records that were reviewed today include: None Review of the above records demonstrates: NA  ASSESSMENT AND PLAN:   CAD S/P unsuccessful PCI Nov 2017 The patient has no new sypmtoms.  No further cardiovascular testing is indicated.  We will continue with aggressive risk reduction and meds as listed.  Ischemic cardiomyopathy I am going to stop her lisinopril.  36 hours later she is going to start Entresto 24/26.  I would like to check a basic metabolic profile in about 2 weeks.  In 3 months we will see her back for possible med titration.    Pulmonary embolus (HCC) I agree that this was unprovoked.  She is tolerating anticoagulation.  No change in therapy.  She is on the appropriate dose because her weight over below 60 kg most of the time.   Dyslipidemia LDL was 73.  HDL 45.  No change in therapy.    Current medicines are reviewed at length with the patient today.  The patient does not have concerns regarding medicines.  The following changes have been made: As above  Labs/ tests ordered today include:    Orders Placed This Encounter  Procedures  . Basic Metabolic Panel (BMET)  . EKG 12-Lead     Disposition:   FU with me in 3 months.     Signed, Rollene Rotunda, MD  04/23/2020 5:31 PM    [facility]

## 2020-04-23 ENCOUNTER — Ambulatory Visit: Payer: Medicare PPO | Admitting: Cardiology

## 2020-04-23 ENCOUNTER — Encounter: Payer: Self-pay | Admitting: Cardiology

## 2020-04-23 ENCOUNTER — Other Ambulatory Visit: Payer: Self-pay

## 2020-04-23 VITALS — BP 122/64 | HR 70 | Ht 61.5 in | Wt 135.0 lb

## 2020-04-23 DIAGNOSIS — E785 Hyperlipidemia, unspecified: Secondary | ICD-10-CM

## 2020-04-23 DIAGNOSIS — Z79899 Other long term (current) drug therapy: Secondary | ICD-10-CM

## 2020-04-23 DIAGNOSIS — I2510 Atherosclerotic heart disease of native coronary artery without angina pectoris: Secondary | ICD-10-CM | POA: Diagnosis not present

## 2020-04-23 DIAGNOSIS — Z9861 Coronary angioplasty status: Secondary | ICD-10-CM | POA: Diagnosis not present

## 2020-04-23 DIAGNOSIS — I255 Ischemic cardiomyopathy: Secondary | ICD-10-CM

## 2020-04-23 MED ORDER — ENTRESTO 24-26 MG PO TABS: 1.0000 | ORAL_TABLET | Freq: Two times a day (BID) | ORAL | 0 refills | Status: DC

## 2020-04-23 MED ORDER — ENTRESTO 24-26 MG PO TABS: 1.0000 | ORAL_TABLET | Freq: Two times a day (BID) | ORAL | 6 refills | Status: DC

## 2020-04-23 MED ORDER — ENTRESTO 24-26 MG PO TABS: 1.0000 | ORAL_TABLET | Freq: Two times a day (BID) | ORAL | 3 refills | Status: DC

## 2020-04-23 NOTE — Patient Instructions (Signed)
Medication Instructions:  STOP- Lisinopril  START- Entresto 24/26 mg by mouth twice a day  *If you need a refill on your cardiac medications before your next appointment, please call your pharmacy*   Lab Work: BMP in 2 weeks  If you have labs (blood work) drawn today and your tests are completely normal, you will receive your results only by: Marland Kitchen MyChart Message (if you have MyChart) OR . A paper copy in the mail If you have any lab test that is abnormal or we need to change your treatment, we will call you to review the results.   Testing/Procedures: None Ordered   Follow-Up: At [facility], you and your health needs are our priority.  As part of our continuing mission to provide you with exceptional heart care, we have created designated Provider Care Teams.  These Care Teams include your primary Cardiologist (physician) and Advanced Practice Providers (APPs -  Physician Assistants and Nurse Practitioners) who all work together to provide you with the care you need, when you need it.  We recommend signing up for the patient portal called "MyChart".  Sign up information is provided on this After Visit Summary.  MyChart is used to connect with patients for Virtual Visits (Telemedicine).  Patients are able to view lab/test results, encounter notes, upcoming appointments, etc.  Non-urgent messages can be sent to your provider as well.   To learn more about what you can do with MyChart, go to [email or web address].    Your next appointment:   3 month(s)  The format for your next appointment:   In Person  Provider:   You may see Rollene Rotunda, MD or one of the following Advanced Practice Providers on your designated Care Team:    Theodore Demark, PA-C  Joni Reining, DNP, ANP

## 2020-04-27 ENCOUNTER — Other Ambulatory Visit: Payer: Self-pay | Admitting: Cardiology

## 2020-04-27 DIAGNOSIS — I2699 Other pulmonary embolism without acute cor pulmonale: Secondary | ICD-10-CM

## 2020-04-29 NOTE — Telephone Encounter (Signed)
27f, 61.2kg, Creatinine, Serum 1.120 mg/ 03/07/2019, lovw/hochrein 04/23/20. Received refill request for eliquis 2.5 but the pt actually qualifies for the 5mg  will route to pharmd pool for review

## 2020-04-29 NOTE — Telephone Encounter (Signed)
On Eliquis for PE prophylaxis.  2.5mg  appropriate

## 2020-05-08 ENCOUNTER — Other Ambulatory Visit: Payer: Self-pay

## 2020-05-08 MED ORDER — CARVEDILOL 6.25 MG PO TABS: ORAL_TABLET | ORAL | 3 refills | Status: DC

## 2020-05-09 DIAGNOSIS — I255 Ischemic cardiomyopathy: Secondary | ICD-10-CM | POA: Diagnosis not present

## 2020-05-09 DIAGNOSIS — Z79899 Other long term (current) drug therapy: Secondary | ICD-10-CM | POA: Diagnosis not present

## 2020-05-09 DIAGNOSIS — Z9861 Coronary angioplasty status: Secondary | ICD-10-CM | POA: Diagnosis not present

## 2020-05-09 DIAGNOSIS — I2510 Atherosclerotic heart disease of native coronary artery without angina pectoris: Secondary | ICD-10-CM | POA: Diagnosis not present

## 2020-05-09 LAB — BASIC METABOLIC PANEL
BUN/Creatinine Ratio: 22 (ref 12–28)
BUN: 27 mg/dL (ref 10–36)
CO2: 22 mmol/L (ref 20–29)
Calcium: 9.6 mg/dL (ref 8.7–10.3)
Chloride: 103 mmol/L (ref 96–106)
Creatinine, Ser: 1.25 mg/dL — ABNORMAL HIGH (ref 0.57–1.00)
Glucose: 117 mg/dL — ABNORMAL HIGH (ref 65–99)
Potassium: 4.9 mmol/L (ref 3.5–5.2)
Sodium: 141 mmol/L (ref 134–144)
eGFR: 40 mL/min/{1.73_m2} — ABNORMAL LOW (ref >59)

## 2020-05-17 ENCOUNTER — Other Ambulatory Visit: Payer: Self-pay | Admitting: Cardiology

## 2020-07-13 ENCOUNTER — Other Ambulatory Visit: Payer: Self-pay | Admitting: Cardiology

## 2020-07-29 NOTE — Progress Notes (Signed)
Cardiology Office Note   Date:  07/30/2020   ID:  Kristen Lewis, DOB [birth year], MRN [id number]  PCP:  Gaspar Garbe, MD  Cardiologist:   Rollene Rotunda, MD   Chief Complaint  Patient presents with   Coronary Artery Disease       History of Present Illness: Kristen Lewis is a 87 y.o. female who presents for follow up of CAD.  She had inferior MI in 2017.  Cath was done but they were unable to open her RCA and the recommendation was for medical treatment. She was admitted to [facility] hospital in Jan 2018 with HCAP and a PE. She was discharged back to [facility] and then re admitted 03/12/16 with pleuritic chest pain which we felt was from her PE.  We have had her on chronic anticoagulation low dose because this was an unprovoked PE.      At the last visit I started [facility].  She did very well with this.  The patient denies any new symptoms such as chest discomfort, neck or arm discomfort. There has been no new shortness of breath, PND or orthopnea. There have been no reported palpitations, presyncope or syncope.  She lives at [facility] and can have her blood pressure checked once a week.  She gets around with a walker.  Past Medical History:  Diagnosis Date   Arthritis    CAD (coronary artery disease)    PCI to her RCA in Nov 2017 in [place] in setting of inferior STEMI. Residual 80% LAD, 70% CFX, 90% OM, EF 50%   Cardiomyopathy (HCC)    EF 45%   Elevated cholesterol    Hearing deficit    Hypertension    Vaginal pessary present     Past Surgical History:  Procedure Laterality Date   CARDIAC CATHETERIZATION     CHOLECYSTECTOMY     VAGINAL HYSTERECTOMY     Leiomyomata/bleeding     Current Outpatient Medications  Medication Sig Dispense Refill   atorvastatin (LIPITOR) 20 MG tablet TAKE 1 TABLET BY MOUTH  DAILY 90 tablet 3   Calcium-Magnesium-Vitamin D (CALCIUM 500 PO) Take 1 tablet by mouth daily.     carvedilol (COREG) 6.25 MG tablet TAKE 1 TABLET BY MOUTH  TWICE A DAY WITH MEALS 180 tablet 3   cholecalciferol (VITAMIN D) 1000 UNITS tablet Take 1,000 Units by mouth every morning.      ELIQUIS 2.5 MG TABS tablet TAKE 1 TABLET TWICE DAILY 180 tablet 1   furosemide (LASIX) 40 MG tablet TAKE 1 TABLET BY MOUTH  DAILY 90 tablet 2   Magnesium 250 MG TABS Take 1 tablet by mouth daily.     Multiple Vitamins-Minerals ([employer]S SPECTRAVITE ADULT 50+) TABS Take 1 tablet by mouth daily.     PARoxetine (PAXIL) 20 MG tablet Take 20 mg by mouth daily.      sacubitril-valsartan (ENTRESTO) 49-51 MG Take 1 tablet by mouth 2 (two) times daily. 180 tablet 3   triamcinolone (KENALOG) 0.1 % daily.     zaleplon (SONATA) 5 MG capsule 1 tablet po qhs prn for insomnia     nitroGLYCERIN (NITROSTAT) 0.4 MG SL tablet Place 1 tablet (0.4 mg total) under the tongue every 5 (five) minutes as needed for chest pain. 25 tablet 2   No current facility-administered medications for this visit.    Allergies:   Patient has no known allergies.    ROS:  Please see the history of present illness.   Otherwise,  review of systems are positive for none.   All other systems are reviewed and negative.    PHYSICAL EXAM: VS:  BP 122/60 (BP Location: Left Arm, Patient Position: Sitting, Cuff Size: Normal)   Pulse 64   Resp 18   Ht 5\' 1"  (1.549 m)   Wt 138 lb 6.4 oz (62.8 kg)   SpO2 94%   BMI 26.15 kg/m  , BMI Body mass index is 26.15 kg/m.  GENERAL:  Well appearing NECK:  No jugular venous distention, waveform within normal limits, carotid upstroke brisk and symmetric, no bruits, no thyromegaly LUNGS:  Clear to auscultation bilaterally CHEST:  Unremarkable HEART:  PMI not displaced or sustained,S1 and S2 within normal limits, no S3, no S4, no clicks, no rubs, 2 out of 6 apical systolic murmur radiating slightly at the aortic outflow tract, no diastolic murmurs ABD:  Flat, positive bowel sounds normal in frequency in pitch, no bruits, no rebound, no guarding, no midline pulsatile mass, no  hepatomegaly, no splenomegaly EXT:  2 plus pulses throughout, no edema, no cyanosis no clubbing   EKG:  EKG is not ordered today.   Recent Labs: 05/09/2020: BUN 27; Creatinine, Ser 1.25; Potassium 4.9; Sodium 141    Lipid Panel No results found for: CHOL, TRIG, HDL, CHOLHDL, VLDL, LDLCALC, LDLDIRECT    Wt Readings from Last 3 Encounters:  07/30/20 138 lb 6.4 oz (62.8 kg)  04/23/20 135 lb (61.2 kg)  07/03/19 136 lb 9.6 oz (62 kg)      Other studies Reviewed: Additional studies/ records that were reviewed today include:  Labs Review of the above records demonstrates: See elsewhere  ASSESSMENT AND PLAN:   CAD S/P unsuccessful PCI Nov 2017 The patient has no new sypmtoms.  No further cardiovascular testing is indicated.  We will continue with aggressive risk reduction and meds as listed.  Ischemic cardiomyopathy Today I am going to titrate her Entresto to 49/51 twice daily.  I will check a basic metabolic profile.  Of note she had specific questions about not taking this with an ACE inhibitor and I assured her she was not need one.  We talked about symptoms that could occur as we uptitrate her medications.  She will look out for lightheadedness and have her blood pressure checked.  Pulmonary embolus (HCC) I agree that this was unprovoked.  She is tolerating anticoagulation.  No change in therapy.  She tolerates anticoagulation.  No change in therapy.  Dyslipidemia LDL was at target at 73.  She had specific questions about PCSK9 inhibitors and I assured her this was not indicated with her intolerance of statins.   Current medicines are reviewed at length with the patient today.  The patient does not have concerns regarding medicines.  The following changes have been made: As above Labs/ tests ordered today include:    Orders Placed This Encounter  Procedures   Basic metabolic panel      Disposition:   FU with APP in 3 months.  Signed, Dec 2017, MD  07/30/2020  3:32 PM    [facility]

## 2020-07-30 ENCOUNTER — Encounter: Payer: Self-pay | Admitting: Cardiology

## 2020-07-30 ENCOUNTER — Ambulatory Visit (INDEPENDENT_AMBULATORY_CARE_PROVIDER_SITE_OTHER): Payer: Medicare PPO | Admitting: Cardiology

## 2020-07-30 ENCOUNTER — Other Ambulatory Visit: Payer: Self-pay

## 2020-07-30 VITALS — BP 122/60 | HR 64 | Resp 18 | Ht 61.0 in | Wt 138.4 lb

## 2020-07-30 DIAGNOSIS — E785 Hyperlipidemia, unspecified: Secondary | ICD-10-CM

## 2020-07-30 DIAGNOSIS — I2699 Other pulmonary embolism without acute cor pulmonale: Secondary | ICD-10-CM | POA: Diagnosis not present

## 2020-07-30 DIAGNOSIS — I255 Ischemic cardiomyopathy: Secondary | ICD-10-CM

## 2020-07-30 DIAGNOSIS — Z9861 Coronary angioplasty status: Secondary | ICD-10-CM

## 2020-07-30 DIAGNOSIS — I2510 Atherosclerotic heart disease of native coronary artery without angina pectoris: Secondary | ICD-10-CM

## 2020-07-30 MED ORDER — ENTRESTO 49-51 MG PO TABS: 1.0000 | ORAL_TABLET | Freq: Two times a day (BID) | ORAL | 3 refills | Status: DC

## 2020-07-30 NOTE — Patient Instructions (Addendum)
Medication Instructions:  INCREASE Entresto to 49-51mg  (1 tablet) twice daily. Per Dr. Antoine Poche you may take two of your current dose of Entresto for each dose until you pick up your new prescription.   *If you need a refill on your cardiac medications before your next appointment, please call your pharmacy*   Lab Work: BMET, to be drawn today.  If you have labs (blood work) drawn today and your tests are completely normal, you will receive your results only by: MyChart Message (if you have MyChart) OR A paper copy in the mail If you have any lab test that is abnormal or we need to change your treatment, we will call you to review the results.   Testing/Procedures: None ordered.    Follow-Up: At [facility], you and your health needs are our priority.  As part of our continuing mission to provide you with exceptional heart care, we have created designated Provider Care Teams.  These Care Teams include your primary Cardiologist (physician) and Advanced Practice Providers (APPs -  Physician Assistants and Nurse Practitioners) who all work together to provide you with the care you need, when you need it.  We recommend signing up for the patient portal called "MyChart".  Sign up information is provided on this After Visit Summary.  MyChart is used to connect with patients for Virtual Visits (Telemedicine).  Patients are able to view lab/test results, encounter notes, upcoming appointments, etc.  Non-urgent messages can be sent to your provider as well.   To learn more about what you can do with MyChart, go to [email or web address].    Your next appointment:   3 month(s)  The format for your next appointment:   In Person  Provider:   You will see one of the following Advanced Practice Providers on your designated Care Team:   Theodore Demark, PA-C Joni Reining, DNP, ANP

## 2020-07-31 LAB — BASIC METABOLIC PANEL
BUN/Creatinine Ratio: 18 (ref 12–28)
BUN: 21 mg/dL (ref 10–36)
CO2: 28 mmol/L (ref 20–29)
Calcium: 9.3 mg/dL (ref 8.7–10.3)
Chloride: 98 mmol/L (ref 96–106)
Creatinine, Ser: 1.17 mg/dL — ABNORMAL HIGH (ref 0.57–1.00)
Glucose: 86 mg/dL (ref 65–99)
Potassium: 4.7 mmol/L (ref 3.5–5.2)
Sodium: 136 mmol/L (ref 134–144)
eGFR: 44 mL/min/{1.73_m2} — ABNORMAL LOW (ref >59)

## 2020-08-23 ENCOUNTER — Other Ambulatory Visit: Payer: Self-pay

## 2020-08-23 ENCOUNTER — Telehealth: Payer: Self-pay | Admitting: Cardiology

## 2020-08-23 MED ORDER — FUROSEMIDE 40 MG PO TABS: 40.0000 mg | ORAL_TABLET | Freq: Every day | ORAL | 2 refills | Status: DC

## 2020-08-23 NOTE — Telephone Encounter (Signed)
*  STAT* If patient is at the pharmacy, call can be transferred to refill team.   1. Which medications need to be refilled? (please list name of each medication and dose if known)  furosemide (LASIX) 40 MG tablet  2. Which pharmacy/location (including street and city if local pharmacy) is medication to be sent to? [id number] [place], [place] - [address]  3. Do they need a 30 day or 90 day supply?   7 day emergency supply Jola Babinski with [facility] called in with the patient on the phone, requesting to have a 7 day supply sent to the patient's local pharmacy, listed above. She states the patient is completely out out medication and her mail order wont arrive for another 5-7 days.

## 2020-10-16 DIAGNOSIS — E7800 Pure hypercholesterolemia, unspecified: Secondary | ICD-10-CM | POA: Diagnosis not present

## 2020-10-16 DIAGNOSIS — M810 Age-related osteoporosis without current pathological fracture: Secondary | ICD-10-CM | POA: Diagnosis not present

## 2020-10-23 DIAGNOSIS — N1831 Chronic kidney disease, stage 3a: Secondary | ICD-10-CM | POA: Diagnosis not present

## 2020-10-23 DIAGNOSIS — Z23 Encounter for immunization: Secondary | ICD-10-CM | POA: Diagnosis not present

## 2020-10-23 DIAGNOSIS — I130 Hypertensive heart and chronic kidney disease with heart failure and stage 1 through stage 4 chronic kidney disease, or unspecified chronic kidney disease: Secondary | ICD-10-CM | POA: Diagnosis not present

## 2020-10-23 DIAGNOSIS — D692 Other nonthrombocytopenic purpura: Secondary | ICD-10-CM | POA: Diagnosis not present

## 2020-10-23 DIAGNOSIS — Z1331 Encounter for screening for depression: Secondary | ICD-10-CM | POA: Diagnosis not present

## 2020-10-23 DIAGNOSIS — I2699 Other pulmonary embolism without acute cor pulmonale: Secondary | ICD-10-CM | POA: Diagnosis not present

## 2020-10-23 DIAGNOSIS — Z0000 Encounter for general adult medical examination without abnormal findings: Secondary | ICD-10-CM | POA: Diagnosis not present

## 2020-10-23 DIAGNOSIS — D6869 Other thrombophilia: Secondary | ICD-10-CM | POA: Diagnosis not present

## 2020-10-23 DIAGNOSIS — I509 Heart failure, unspecified: Secondary | ICD-10-CM | POA: Diagnosis not present

## 2020-10-23 DIAGNOSIS — Z1389 Encounter for screening for other disorder: Secondary | ICD-10-CM | POA: Diagnosis not present

## 2020-10-23 DIAGNOSIS — I2510 Atherosclerotic heart disease of native coronary artery without angina pectoris: Secondary | ICD-10-CM | POA: Diagnosis not present

## 2020-10-23 DIAGNOSIS — E7800 Pure hypercholesterolemia, unspecified: Secondary | ICD-10-CM | POA: Diagnosis not present

## 2020-10-31 ENCOUNTER — Ambulatory Visit: Payer: Medicare PPO | Admitting: Physician Assistant

## 2020-10-31 ENCOUNTER — Other Ambulatory Visit: Payer: Self-pay

## 2020-10-31 ENCOUNTER — Encounter: Payer: Self-pay | Admitting: Physician Assistant

## 2020-10-31 VITALS — BP 116/58 | HR 67 | Ht 61.0 in | Wt 137.2 lb

## 2020-10-31 DIAGNOSIS — I2510 Atherosclerotic heart disease of native coronary artery without angina pectoris: Secondary | ICD-10-CM

## 2020-10-31 DIAGNOSIS — I2782 Chronic pulmonary embolism: Secondary | ICD-10-CM

## 2020-10-31 DIAGNOSIS — I255 Ischemic cardiomyopathy: Secondary | ICD-10-CM | POA: Diagnosis not present

## 2020-10-31 DIAGNOSIS — I10 Essential (primary) hypertension: Secondary | ICD-10-CM

## 2020-10-31 DIAGNOSIS — E785 Hyperlipidemia, unspecified: Secondary | ICD-10-CM

## 2020-10-31 NOTE — Progress Notes (Signed)
Cardiology Office Note:    Date:  11/02/2020   ID:  Kristen Lewis, DOB 06/01/1925, MRN [id number]  PCP:  Gaspar Garbe, MD   [facility] Providers Cardiologist:  Rollene Rotunda, MD     Referring MD: Gaspar Garbe, MD   Chief Complaint  Patient presents with   Follow-up    Seen for Dr. Antoine Poche    History of Present Illness:    Kristen Lewis is a 87 y.o. female with a hx of CAD, hypertension, hyperlipidemia, PE and history of ischemic cardiomyopathy.  Patient had an inferior MI in 2017.  Cardiac catheterization showed occluded RCA, medical management was recommended.  She was admitted in January 2018 with pneumonia and PE.  She was discharged on chronic anticoagulation therapy due to unprovoked PE.  Last echocardiogram obtained on 04/25/2019 showed EF 35 to 40%, grade 1 DD, mild MR, mild AI.  She lives at [facility] and gets around with a walker.  She was seen by Dr. Antoine Poche on 07/30/2020 at which time she was tolerating the new Entresto.  Sherryll Burger was further increased to 49-51 mg twice a day.  50-month follow-up was recommended.  Patient presents today along with her friend.  She still resides at [facility].  She does her daily walk down to the cafeteria and back without any exertional chest pain worsening dyspnea.  She is tolerating the higher dose of Entresto without any issue.  Recent blood work obtained earlier this month at her PCPs office showed stable renal function and electrolyte.  She appears to be euvolemic on physical exam and the denies any orthopnea or PND.  I will defer to MD to decide whether or not to repeat echocardiogram in this patient.  Given her advanced age of 87 years old, I did not order echocardiogram today as it is unlikely to change my treatment plan.   Past Medical History:  Diagnosis Date   Arthritis    CAD (coronary artery disease)    PCI to her RCA in Nov 2017 in [place] in setting of inferior STEMI. Residual 80% LAD, 70% CFX, 90%  OM, EF 50%   Cardiomyopathy (HCC)    EF 45%   Elevated cholesterol    Hearing deficit    Hypertension    Vaginal pessary present     Past Surgical History:  Procedure Laterality Date   CARDIAC CATHETERIZATION     CHOLECYSTECTOMY     VAGINAL HYSTERECTOMY     Leiomyomata/bleeding    Current Medications: Current Meds  Medication Sig   atorvastatin (LIPITOR) 20 MG tablet TAKE 1 TABLET BY MOUTH  DAILY   Calcium-Magnesium-Vitamin D (CALCIUM 500 PO) Take 1 tablet by mouth daily.   carvedilol (COREG) 6.25 MG tablet TAKE 1 TABLET BY MOUTH TWICE A DAY WITH MEALS   cholecalciferol (VITAMIN D) 1000 UNITS tablet Take 1,000 Units by mouth every morning.    ELIQUIS 2.5 MG TABS tablet TAKE 1 TABLET TWICE DAILY   furosemide (LASIX) 40 MG tablet Take 1 tablet (40 mg total) by mouth daily.   Magnesium 250 MG TABS Take 1 tablet by mouth daily.   Multiple Vitamins-Minerals ([employer]S SPECTRAVITE ADULT 50+) TABS Take 1 tablet by mouth daily.   PARoxetine (PAXIL) 20 MG tablet Take 20 mg by mouth daily.    sacubitril-valsartan (ENTRESTO) 49-51 MG Take 1 tablet by mouth 2 (two) times daily.   triamcinolone (KENALOG) 0.1 % daily.   zaleplon (SONATA) 5 MG capsule 1 tablet po qhs prn for  insomnia     Allergies:   Patient has no known allergies.   Social History   Socioeconomic History   Marital status: Married    Spouse name: Not on file   Number of children: Not on file   Years of education: Not on file   Highest education level: Not on file  Occupational History   Not on file  Tobacco Use   Smoking status: Never   Smokeless tobacco: Never  Substance and Sexual Activity   Alcohol use: Yes    Alcohol/week: 0.0 standard drinks    Comment: Rare   Drug use: No   Sexual activity: Never    Birth control/protection: Surgical, Post-menopausal    Comment: 1st intercourse 73 yo-1 partner  Other Topics Concern   Not on file  Social History Narrative   Not on file   Social Determinants of Health    Financial Resource Strain: Not on file  Food Insecurity: Not on file  Transportation Needs: Not on file  Physical Activity: Not on file  Stress: Not on file  Social Connections: Not on file     Family History: The patient's family history includes Cancer in her mother; Heart attack in her father; Heart disease in her brother; Hypertension in her brother, father, and mother.  ROS:   Please see the history of present illness.     All other systems reviewed and are negative.  EKGs/Labs/Other Studies Reviewed:    The following studies were reviewed today:  Echo 04/25/2019 1. Left ventricular ejection fraction, by estimation, is 35 to 40%. The  left ventricle has moderately decreased function. The left ventricle has  no regional wall motion abnormalities. Left ventricular diastolic  parameters are consistent with Grade I  diastolic dysfunction (impaired relaxation).   2. Right ventricular systolic function is normal. The right ventricular  size is normal. There is mildly elevated pulmonary artery systolic  pressure.   3. The mitral valve is normal in structure. Mild mitral valve  regurgitation. No evidence of mitral stenosis.   4. The aortic valve is tricuspid. Aortic valve regurgitation is mild.  Mild to moderate aortic valve sclerosis/calcification is present, without  any evidence of aortic stenosis.   5. The inferior vena cava is normal in size with greater than 50%  respiratory variability, suggesting right atrial pressure of 3 mmHg.   Comparison(s): No significant change from prior study. The left  ventricular function is unchanged.   EKG:  EKG is not ordered today.    Recent Labs: 07/30/2020: BUN 21; Creatinine, Ser 1.17; Potassium 4.7; Sodium 136  Recent Lipid Panel No results found for: CHOL, TRIG, HDL, CHOLHDL, VLDL, LDLCALC, LDLDIRECT   Risk Assessment/Calculations:           Physical Exam:    VS:  BP (!) 116/58 (BP Location: Right Arm, Patient Position:  Sitting, Cuff Size: Normal)   Pulse 67   Ht 5\' 1"  (1.549 m)   Wt 137 lb 3.2 oz (62.2 kg)   SpO2 96%   BMI 25.92 kg/m     Wt Readings from Last 3 Encounters:  10/31/20 137 lb 3.2 oz (62.2 kg)  07/30/20 138 lb 6.4 oz (62.8 kg)  04/23/20 135 lb (61.2 kg)     GEN:  Well nourished, well developed in no acute distress HEENT: Normal NECK: No JVD; No carotid bruits LYMPHATICS: No lymphadenopathy CARDIAC: RRR, no murmurs, rubs, gallops RESPIRATORY:  Clear to auscultation without rales, wheezing or rhonchi  ABDOMEN: Soft, non-tender, non-distended MUSCULOSKELETAL:  No edema; No deformity  SKIN: Warm and dry NEUROLOGIC:  Alert and oriented x 3 PSYCHIATRIC:  Normal affect   ASSESSMENT:    1. Coronary artery disease involving native coronary artery of native heart without angina pectoris   2. Primary hypertension   3. Hyperlipidemia LDL goal <70   4. Chronic pulmonary embolism without acute cor pulmonale, unspecified pulmonary embolism type (HCC)   5. Ischemic cardiomyopathy    PLAN:    In order of problems listed above:  CAD: Denies any recent chest pain.  She lives at [facility] and does her daily walk to [employer].  Hypertension: Blood pressure stable  Hyperlipidemia: Continue Lipitor.  History of PE: On Eliquis  Ischemic cardiomyopathy: Continue carvedilol and Entresto.  Will defer to MD whether or not to order repeat echocardiogram.  Given her advanced age, I did not order echo today as it is unlikely to change my treatment plan and since she is feeling well, there was no urgent need to obtain echocardiogram.        Medication Adjustments/Labs and Tests Ordered: Current medicines are reviewed at length with the patient today.  Concerns regarding medicines are outlined above.  No orders of the defined types were placed in this encounter.  No orders of the defined types were placed in this encounter.   Patient Instructions  Medication Instructions:  Your  physician recommends that you continue on your current medications as directed. Please refer to the Current Medication list given to you today.  *If you need a refill on your cardiac medications before your next appointment, please call your pharmacy*  Lab Work: NONE ordered at this time of appointment   If you have labs (blood work) drawn today and your tests are completely normal, you will receive your results only by: MyChart Message (if you have MyChart) OR A paper copy in the mail If you have any lab test that is abnormal or we need to change your treatment, we will call you to review the results.  Testing/Procedures: NONE ordered at this time of appointment   Follow-Up: At [facility], you and your health needs are our priority.  As part of our continuing mission to provide you with exceptional heart care, we have created designated Provider Care Teams.  These Care Teams include your primary Cardiologist (physician) and Advanced Practice Providers (APPs -  Physician Assistants and Nurse Practitioners) who all work together to provide you with the care you need, when you need it.  Your next appointment:   4-5 month(s)  The format for your next appointment:   In Person  Provider:   Rollene Rotunda, MD  Other Instructions    Signed, Azalee Course, PA  11/02/2020 11:32 PM    [facility]

## 2020-10-31 NOTE — Patient Instructions (Signed)
Medication Instructions:  Your physician recommends that you continue on your current medications as directed. Please refer to the Current Medication list given to you today.  *If you need a refill on your cardiac medications before your next appointment, please call your pharmacy*  Lab Work: NONE ordered at this time of appointment   If you have labs (blood work) drawn today and your tests are completely normal, you will receive your results only by: MyChart Message (if you have MyChart) OR A paper copy in the mail If you have any lab test that is abnormal or we need to change your treatment, we will call you to review the results.  Testing/Procedures: NONE ordered at this time of appointment   Follow-Up: At [facility], you and your health needs are our priority.  As part of our continuing mission to provide you with exceptional heart care, we have created designated Provider Care Teams.  These Care Teams include your primary Cardiologist (physician) and Advanced Practice Providers (APPs -  Physician Assistants and Nurse Practitioners) who all work together to provide you with the care you need, when you need it.  Your next appointment:   4-5 month(s)  The format for your next appointment:   In Person  Provider:   Rollene Rotunda, MD  Other Instructions

## 2020-11-02 ENCOUNTER — Encounter: Payer: Self-pay | Admitting: Physician Assistant

## 2020-11-25 DIAGNOSIS — H35363 Drusen (degenerative) of macula, bilateral: Secondary | ICD-10-CM | POA: Diagnosis not present

## 2020-11-25 DIAGNOSIS — H43813 Vitreous degeneration, bilateral: Secondary | ICD-10-CM | POA: Diagnosis not present

## 2020-11-25 DIAGNOSIS — H04123 Dry eye syndrome of bilateral lacrimal glands: Secondary | ICD-10-CM | POA: Diagnosis not present

## 2020-11-25 DIAGNOSIS — H524 Presbyopia: Secondary | ICD-10-CM | POA: Diagnosis not present

## 2020-11-26 ENCOUNTER — Other Ambulatory Visit: Payer: Self-pay | Admitting: Cardiology

## 2020-11-26 DIAGNOSIS — I2699 Other pulmonary embolism without acute cor pulmonale: Secondary | ICD-10-CM

## 2020-11-26 NOTE — Telephone Encounter (Signed)
Prescription refill request for Eliquis received. Last office visit:meng 10/31/20 Scr:1.0 10/16/20 Age: 26f Weight:62.2kg

## 2021-02-06 ENCOUNTER — Telehealth: Payer: Self-pay | Admitting: Cardiology

## 2021-02-06 ENCOUNTER — Other Ambulatory Visit: Payer: Self-pay | Admitting: Cardiology

## 2021-02-06 NOTE — Telephone Encounter (Signed)
°*  STAT* If patient is at the pharmacy, call can be transferred to refill team.   1. Which medications need to be refilled? (please list name of each medication and dose if known) sacubitril-valsartan (ENTRESTO) 49-51 MG  2. Which pharmacy/location (including street and city if local pharmacy) is medication to be sent to? [facility] - [place], [place] - [address]  3. Do they need a 30 day or 90 day supply? 90    Patient is out of medication so she needs a few to be [facility]: [id number] [place], [place] - [address]

## 2021-02-07 MED ORDER — ENTRESTO 49-51 MG PO TABS: 1.0000 | ORAL_TABLET | Freq: Two times a day (BID) | ORAL | 3 refills | Status: DC

## 2021-02-07 NOTE — Telephone Encounter (Signed)
Refill for Entresto sent to [facility] 02/07/21

## 2021-03-19 NOTE — Progress Notes (Deleted)
Cardiology Office Note   Date:  03/19/2021   ID:  Kristen Lewis, DOB 10-30-1925, MRN [id number]  PCP:  Gaspar Garbe, MD  Cardiologist:   Rollene Rotunda, MD   No chief complaint on file.      History of Present Illness: Kristen Lewis is a 88 y.o. female who presents for follow up of CAD.  She had inferior MI in 2017.  Cath was done but they were unable to open her RCA and the recommendation was for medical treatment. She was admitted to [facility] hospital in Jan 2018 with HCAP and a PE. She was discharged back to [facility] and then re admitted 03/12/16 with pleuritic chest pain which we felt was from her PE.  We have had her on chronic anticoagulation low dose because this was an unprovoked PE.      Since she was last seen ***   *** At the last visit I started [facility].  She did very well with this.  The patient denies any new symptoms such as chest discomfort, neck or arm discomfort. There has been no new shortness of breath, PND or orthopnea. There have been no reported palpitations, presyncope or syncope.  She lives at [facility] and can have her blood pressure checked once a week.  She gets around with a walker.  Past Medical History:  Diagnosis Date   Arthritis    CAD (coronary artery disease)    PCI to her RCA in Nov 2017 in [place] in setting of inferior STEMI. Residual 80% LAD, 70% CFX, 90% OM, EF 50%   Cardiomyopathy (HCC)    EF 45%   Elevated cholesterol    Hearing deficit    Hypertension    Vaginal pessary present     Past Surgical History:  Procedure Laterality Date   CARDIAC CATHETERIZATION     CHOLECYSTECTOMY     VAGINAL HYSTERECTOMY     Leiomyomata/bleeding     Current Outpatient Medications  Medication Sig Dispense Refill   atorvastatin (LIPITOR) 20 MG tablet TAKE 1 TABLET EVERY DAY 90 tablet 3   Calcium-Magnesium-Vitamin D (CALCIUM 500 PO) Take 1 tablet by mouth daily.     carvedilol (COREG) 6.25 MG tablet TAKE 1 TABLET BY MOUTH TWICE A DAY  WITH MEALS 180 tablet 3   cholecalciferol (VITAMIN D) 1000 UNITS tablet Take 1,000 Units by mouth every morning.      ELIQUIS 2.5 MG TABS tablet TAKE 1 TABLET TWICE DAILY 180 tablet 1   furosemide (LASIX) 40 MG tablet TAKE 1 TABLET EVERY DAY 90 tablet 2   Magnesium 250 MG TABS Take 1 tablet by mouth daily.     Multiple Vitamins-Minerals (CVS SPECTRAVITE ADULT 50+) TABS Take 1 tablet by mouth daily.     nitroGLYCERIN (NITROSTAT) 0.4 MG SL tablet Place 1 tablet (0.4 mg total) under the tongue every 5 (five) minutes as needed for chest pain. 25 tablet 2   PARoxetine (PAXIL) 20 MG tablet Take 20 mg by mouth daily.      sacubitril-valsartan (ENTRESTO) 49-51 MG Take 1 tablet by mouth 2 (two) times daily. 180 tablet 3   triamcinolone (KENALOG) 0.1 % daily.     zaleplon (SONATA) 5 MG capsule 1 tablet po qhs prn for insomnia     No current facility-administered medications for this visit.    Allergies:   Patient has no known allergies.    ROS:  Please see the history of present illness.   Otherwise, review of  systems are positive for ***.   All other systems are reviewed and negative.    PHYSICAL EXAM: VS:  There were no vitals taken for this visit. , BMI There is no height or weight on file to calculate BMI.  GENERAL:  Well appearing NECK:  No jugular venous distention, waveform within normal limits, carotid upstroke brisk and symmetric, no bruits, no thyromegaly LUNGS:  Clear to auscultation bilaterally CHEST:  Unremarkable HEART:  PMI not displaced or sustained,S1 and S2 within normal limits, no S3, no S4, no clicks, no rubs, *** murmurs ABD:  Flat, positive bowel sounds normal in frequency in pitch, no bruits, no rebound, no guarding, no midline pulsatile mass, no hepatomegaly, no splenomegaly EXT:  2 plus pulses throughout, no edema, no cyanosis no clubbing   ***GENERAL:  Well appearing NECK:  No jugular venous distention, waveform within normal limits, carotid upstroke brisk and  symmetric, no bruits, no thyromegaly LUNGS:  Clear to auscultation bilaterally CHEST:  Unremarkable HEART:  PMI not displaced or sustained,S1 and S2 within normal limits, no S3, no S4, no clicks, no rubs, 2 out of 6 apical systolic murmur radiating slightly at the aortic outflow tract, no diastolic murmurs ABD:  Flat, positive bowel sounds normal in frequency in pitch, no bruits, no rebound, no guarding, no midline pulsatile mass, no hepatomegaly, no splenomegaly EXT:  2 plus pulses throughout, no edema, no cyanosis no clubbing   EKG:  EKG is *** ordered today. ***  Recent Labs: 07/30/2020: BUN 21; Creatinine, Ser 1.17; Potassium 4.7; Sodium 136    Lipid Panel No results found for: CHOL, TRIG, HDL, CHOLHDL, VLDL, LDLCALC, LDLDIRECT    Wt Readings from Last 3 Encounters:  10/31/20 137 lb 3.2 oz (62.2 kg)  07/30/20 138 lb 6.4 oz (62.8 kg)  04/23/20 135 lb (61.2 kg)      Other studies Reviewed: Additional studies/ records that were reviewed today include:  *** Review of the above records demonstrates: ***  ASSESSMENT AND PLAN:   CAD S/P unsuccessful PCI Nov 2017 ***  The patient has no new sypmtoms.  No further cardiovascular testing is indicated.  We will continue with aggressive risk reduction and meds as listed.  Ischemic cardiomyopathy ***  Today I am going to titrate her Entresto to 49/51 twice daily.  I will check a basic metabolic profile.  Of note she had specific questions about not taking this with an ACE inhibitor and I assured her she was not need one.  We talked about symptoms that could occur as we uptitrate her medications.  She will look out for lightheadedness and have her blood pressure checked.  Pulmonary embolus (HCC) ***  I agree that this was unprovoked.  She is tolerating anticoagulation.  No change in therapy.  She tolerates anticoagulation.  No change in therapy.  Dyslipidemia LDL was *** at target at 73.  She had specific questions about PCSK9  inhibitors and I assured her this was not indicated with her intolerance of statins.   Current medicines are reviewed at length with the patient today.  The patient does not have concerns regarding medicines.  The following changes have been made: *** Labs/ tests ordered today include:  ***  No orders of the defined types were placed in this encounter.     Disposition:   FU with APP ***  Signed, Minus Breeding, MD  03/19/2021 6:41 PM    [place]

## 2021-03-20 ENCOUNTER — Ambulatory Visit: Payer: Medicare PPO | Admitting: Cardiology

## 2021-03-20 DIAGNOSIS — E785 Hyperlipidemia, unspecified: Secondary | ICD-10-CM

## 2021-03-20 DIAGNOSIS — I255 Ischemic cardiomyopathy: Secondary | ICD-10-CM

## 2021-03-20 DIAGNOSIS — I2510 Atherosclerotic heart disease of native coronary artery without angina pectoris: Secondary | ICD-10-CM

## 2021-04-15 NOTE — Progress Notes (Signed)
?  ?Cardiology Office Note ? ? ?Date:  04/16/2021  ? ?ID:  Kristen Lewis, DOB 23-Nov-1925, MRN [id number] ? ?PCP:  Haywood Pao, MD  ?Cardiologist:   Minus Breeding, MD  ? ?No chief complaint on file. ? ? ? ?  ?History of Present Illness: ?Kristen Lewis is a 88 y.o. female who presents for follow up of CAD.  She had inferior MI in 2017.  Cath was done but they were unable to open her RCA and the recommendation was for medical treatment. She was admitted to [facility] hospital in Jan 2018 with HCAP and a PE. She was discharged back to [facility] and then re admitted 03/12/16 with pleuritic chest pain which we felt was from her PE.  We have had her on chronic anticoagulation low dose because this was an unprovoked PE.     ? ?Since she was last seen she has done well.  She lives at [facility] and does a lot of walking to the dining hall usually with a walker.  At the last visit I increased her carvedilol.  Identified her Kristen Lewis a little bit for her mildly reduced ejection fraction 35 to 40%.  She is done well.  The patient denies any new symptoms such as chest discomfort, neck or arm discomfort. There has been no new shortness of breath, PND or orthopnea. There have been no reported palpitations, presyncope or syncope.  ? ? ?Past Medical History:  ?Diagnosis Date  ? Arthritis   ? CAD (coronary artery disease)   ? PCI to her RCA in Nov 2017 in [place] in setting of inferior STEMI. Residual 80% LAD, 70% CFX, 90% OM, EF 50%  ? Cardiomyopathy ([place])   ? EF 45%  ? Elevated cholesterol   ? Hearing deficit   ? Hypertension   ? Vaginal pessary present   ? ? ?Past Surgical History:  ?Procedure Laterality Date  ? CARDIAC CATHETERIZATION    ? CHOLECYSTECTOMY    ? VAGINAL HYSTERECTOMY    ? Leiomyomata/bleeding  ? ? ? ?Current Outpatient Medications  ?Medication Sig Dispense Refill  ? atorvastatin (LIPITOR) 20 MG tablet TAKE 1 TABLET EVERY DAY 90 tablet 3  ? Calcium-Magnesium-Vitamin D (CALCIUM 500 PO) Take 1 tablet by mouth  daily.    ? carvedilol (COREG) 6.25 MG tablet TAKE 1 TABLET BY MOUTH TWICE A DAY WITH MEALS 180 tablet 3  ? cholecalciferol (VITAMIN D) 1000 UNITS tablet Take 1,000 Units by mouth every morning.     ? ELIQUIS 2.5 MG TABS tablet TAKE 1 TABLET TWICE DAILY 180 tablet 1  ? furosemide (LASIX) 40 MG tablet TAKE 1 TABLET EVERY DAY 90 tablet 2  ? Magnesium 250 MG TABS Take 1 tablet by mouth daily.    ? Multiple Vitamins-Minerals ([employer]S SPECTRAVITE ADULT 50+) TABS Take 1 tablet by mouth daily.    ? nitroGLYCERIN (NITROSTAT) 0.4 MG SL tablet Place 1 tablet (0.4 mg total) under the tongue every 5 (five) minutes as needed for chest pain. 25 tablet 2  ? PARoxetine (PAXIL) 20 MG tablet Take 20 mg by mouth daily.     ? sacubitril-valsartan (ENTRESTO) 49-51 MG Take 1 tablet by mouth 2 (two) times daily. 180 tablet 3  ? triamcinolone (KENALOG) 0.1 % daily.    ? zaleplon (SONATA) 5 MG capsule 1 tablet po qhs prn for insomnia    ? ?No current facility-administered medications for this visit.  ? ? ?Allergies:   Patient has no known allergies.  ? ? ?  ROS:  Please see the history of present illness.   Otherwise, review of systems are positive for none.   All other systems are reviewed and negative.  ? ? ?PHYSICAL EXAM: ?VS:  BP 124/68   Pulse 73   Ht 5\' 1"  (1.549 m)   Wt 142 lb 6.4 oz (64.6 kg)   SpO2 96%   BMI 26.91 kg/m?  , BMI Body mass index is 26.91 kg/m?.  ?GENERAL:  Well appearing ?NECK:  No jugular venous distention, waveform within normal limits, carotid upstroke brisk and symmetric, no bruits, no thyromegaly ?LUNGS:  Clear to auscultation bilaterally ?CHEST:  Unremarkable ?HEART:  PMI not displaced or sustained,S1 and S2 within normal limits, no S3, no S4, no clicks, no rubs, 2 out of 6 brief apical systolic murmur radiating slightly at the regretfully tract, no diastolic murmurs ?ABD:  Flat, positive bowel sounds normal in frequency in pitch, no bruits, no rebound, no guarding, no midline pulsatile mass, no hepatomegaly, no  splenomegaly ?EXT:  2 plus pulses throughout, no edema, no cyanosis no clubbing ? ? ?EKG:  EKG is  ordered today. ?Sinus rhythm, rate 60, axis leftward, poor anterior R wave progression, no acute ST-T wave changes, no change from previous ? ?Recent Labs: ?07/30/2020: BUN 21; Creatinine, Ser 1.17; Potassium 4.7; Sodium 136  ? ? ?Lipid Panel ?No results found for: CHOL, TRIG, HDL, CHOLHDL, VLDL, LDLCALC, LDLDIRECT ?  ? ?Wt Readings from Last 3 Encounters:  ?04/16/21 142 lb 6.4 oz (64.6 kg)  ?10/31/20 137 lb 3.2 oz (62.2 kg)  ?07/30/20 138 lb 6.4 oz (62.8 kg)  ?  ? ? ?Other studies Reviewed: ?Additional studies/ records that were reviewed today include:  Labs ?Review of the above records demonstrates: See elsewhere ? ?ASSESSMENT AND PLAN: ? ? ?CAD S/P unsuccessful PCI Nov 2017 ?She has had no new symptoms.  No change in therapy. ? ?Ischemic cardiomyopathy ?Because of her size, age and mild frailty I will not titrate her meds further.  She is doing well.  ? ?Pulmonary embolus ([place]) ?This was an unprovoked pulmonary embolism.  No change in therapy.  She tolerates anticoagulation.  Continue meds as listed. ? ?Dyslipidemia ?LDL was 63.  No change in therapy.  ? ?Current medicines are reviewed at length with the patient today.  The patient does not have concerns regarding medicines. ? ?The following changes have been made: None ?Labs/ tests ordered today include: None ? ?Orders Placed This Encounter  ?Procedures  ? EKG 12-Lead  ? ? ? ? ?Disposition:   FU with me in 1 year ? ?Signed, ?Minus Breeding, MD  ?04/16/2021 10:03 AM    ?[place] ?

## 2021-04-16 ENCOUNTER — Encounter: Payer: Self-pay | Admitting: Cardiology

## 2021-04-16 ENCOUNTER — Ambulatory Visit: Payer: Medicare PPO | Admitting: Cardiology

## 2021-04-16 ENCOUNTER — Other Ambulatory Visit: Payer: Self-pay

## 2021-04-16 VITALS — BP 124/68 | HR 73 | Ht 61.0 in | Wt 142.4 lb

## 2021-04-16 DIAGNOSIS — I2510 Atherosclerotic heart disease of native coronary artery without angina pectoris: Secondary | ICD-10-CM

## 2021-04-16 DIAGNOSIS — I255 Ischemic cardiomyopathy: Secondary | ICD-10-CM

## 2021-04-16 DIAGNOSIS — I2699 Other pulmonary embolism without acute cor pulmonale: Secondary | ICD-10-CM

## 2021-04-16 DIAGNOSIS — E785 Hyperlipidemia, unspecified: Secondary | ICD-10-CM

## 2021-04-16 NOTE — Patient Instructions (Addendum)
Medication Instructions:   No changes  *If you need a refill on your cardiac medications before your next appointment, please call your pharmacy*   Lab Work: Not needed    Testing/Procedures:  Not needed  Follow-Up: At [facility], you and your health needs are our priority.  As part of our continuing mission to provide you with exceptional heart care, we have created designated Provider Care Teams.  These Care Teams include your primary Cardiologist (physician) and Advanced Practice Providers (APPs -  Physician Assistants and Nurse Practitioners) who all work together to provide you with the care you need, when you need it.     Your next appointment:   12 month(s)  The format for your next appointment:   In Person  Provider:   James Hochrein, MD   

## 2021-04-24 DIAGNOSIS — E7800 Pure hypercholesterolemia, unspecified: Secondary | ICD-10-CM | POA: Diagnosis not present

## 2021-04-24 DIAGNOSIS — F321 Major depressive disorder, single episode, moderate: Secondary | ICD-10-CM | POA: Diagnosis not present

## 2021-04-24 DIAGNOSIS — D692 Other nonthrombocytopenic purpura: Secondary | ICD-10-CM | POA: Diagnosis not present

## 2021-04-24 DIAGNOSIS — N1831 Chronic kidney disease, stage 3a: Secondary | ICD-10-CM | POA: Diagnosis not present

## 2021-04-24 DIAGNOSIS — Z7901 Long term (current) use of anticoagulants: Secondary | ICD-10-CM | POA: Diagnosis not present

## 2021-04-24 DIAGNOSIS — L600 Ingrowing nail: Secondary | ICD-10-CM | POA: Diagnosis not present

## 2021-04-24 DIAGNOSIS — I2699 Other pulmonary embolism without acute cor pulmonale: Secondary | ICD-10-CM | POA: Diagnosis not present

## 2021-04-24 DIAGNOSIS — I130 Hypertensive heart and chronic kidney disease with heart failure and stage 1 through stage 4 chronic kidney disease, or unspecified chronic kidney disease: Secondary | ICD-10-CM | POA: Diagnosis not present

## 2021-04-24 DIAGNOSIS — N952 Postmenopausal atrophic vaginitis: Secondary | ICD-10-CM | POA: Diagnosis not present

## 2021-05-21 ENCOUNTER — Ambulatory Visit: Payer: Medicare PPO | Admitting: Podiatry

## 2021-05-21 DIAGNOSIS — L600 Ingrowing nail: Secondary | ICD-10-CM

## 2021-05-21 NOTE — Progress Notes (Signed)
?Subjective:  ?Patient ID: Kristen Lewis, female    DOB: 04/23/1925,  MRN: [id number] ? ?Chief Complaint  ?Patient presents with  ? Ingrown Toenail  ?  Left hallux ingrown   ? ? ?88 y.o. female presents with the above complaint.  Patient presents with complaint left hallux medial border ingrown.  Patient states is painful to touch.  She would like to have removed she has not seen anyone else prior to seeing me.  Hurts with ambulation has progressive gotten worse.  She denies any lower extremity diabetes or peripheral vascular disease. ? ? ?Review of Systems: Negative except as noted in the HPI. Denies N/V/F/Ch. ? ?Past Medical History:  ?Diagnosis Date  ? Arthritis   ? CAD (coronary artery disease)   ? PCI to her RCA in Nov 2017 in [place] in setting of inferior STEMI. Residual 80% LAD, 70% CFX, 90% OM, EF 50%  ? Cardiomyopathy ([facility])   ? EF 45%  ? Elevated cholesterol   ? Hearing deficit   ? Hypertension   ? Vaginal pessary present   ? ? ?Current Outpatient Medications:  ?  atorvastatin (LIPITOR) 20 MG tablet, TAKE 1 TABLET EVERY DAY, Disp: 90 tablet, Rfl: 3 ?  Calcium-Magnesium-Vitamin D (CALCIUM 500 PO), Take 1 tablet by mouth daily., Disp: , Rfl:  ?  carvedilol (COREG) 6.25 MG tablet, TAKE 1 TABLET BY MOUTH TWICE A DAY WITH MEALS, Disp: 180 tablet, Rfl: 3 ?  cholecalciferol (VITAMIN D) 1000 UNITS tablet, Take 1,000 Units by mouth every morning. , Disp: , Rfl:  ?  ELIQUIS 2.5 MG TABS tablet, TAKE 1 TABLET TWICE DAILY, Disp: 180 tablet, Rfl: 1 ?  furosemide (LASIX) 40 MG tablet, TAKE 1 TABLET EVERY DAY, Disp: 90 tablet, Rfl: 2 ?  Magnesium 250 MG TABS, Take 1 tablet by mouth daily., Disp: , Rfl:  ?  Multiple Vitamins-Minerals (CVS SPECTRAVITE ADULT 50+) TABS, Take 1 tablet by mouth daily., Disp: , Rfl:  ?  nitroGLYCERIN (NITROSTAT) 0.4 MG SL tablet, Place 1 tablet (0.4 mg total) under the tongue every 5 (five) minutes as needed for chest pain., Disp: 25 tablet, Rfl: 2 ?  PARoxetine (PAXIL) 20 MG tablet, Take 20 mg  by mouth daily. , Disp: , Rfl:  ?  sacubitril-valsartan (ENTRESTO) 49-51 MG, Take 1 tablet by mouth 2 (two) times daily., Disp: 180 tablet, Rfl: 3 ?  triamcinolone (KENALOG) 0.1 %, daily., Disp: , Rfl:  ?  zaleplon (SONATA) 5 MG capsule, 1 tablet po qhs prn for insomnia, Disp: , Rfl:  ? ?Social History  ? ?Tobacco Use  ?Smoking Status Never  ?Smokeless Tobacco Never  ? ? ?No Known Allergies ?Objective:  ?There were no vitals filed for this visit. ?There is no height or weight on file to calculate BMI. ?Constitutional Well developed. ?Well nourished.  ?Vascular Dorsalis pedis pulses palpable bilaterally. ?Posterior tibial pulses palpable bilaterally. ?Capillary refill normal to all digits.  ?No cyanosis or clubbing noted. ?Pedal hair growth normal.  ?Neurologic Normal speech. ?Oriented to person, place, and time. ?Epicritic sensation to light touch grossly present bilaterally.  ?Dermatologic Painful ingrowing nail at medial nail borders of the hallux nail left. ?No other open wounds. ?No skin lesions.  ?Orthopedic: Normal joint ROM without pain or crepitus bilaterally. ?No visible deformities. ?No bony tenderness.  ? ?Radiographs: None ?Assessment:  ?No diagnosis found. ?Plan:  ?Patient was evaluated and treated and all questions answered. ? ?Ingrown Nail, left ?-Patient elects to proceed with minor surgery to remove ingrown toenail  removal today. Consent reviewed and signed by patient. ?-Ingrown nail excised. See procedure note. ?-Educated on post-procedure care including soaking. Written instructions provided and reviewed. ?-Patient to follow up in 2 weeks for nail check. ? ?Procedure: Excision of Ingrown Toenail ?Location: Left 1st toe medial nail borders. ?Anesthesia: Lidocaine 1% plain; 1.5 mL and Marcaine 0.5% plain; 1.5 mL, digital block. ?Skin Prep: Betadine. ?Dressing: Silvadene; telfa; dry, sterile, compression dressing. ?Technique: Following skin prep, the toe was exsanguinated and a tourniquet was secured  at the base of the toe. The affected nail border was freed, split with a nail splitter, and excised. Chemical matrixectomy was then performed with phenol and irrigated out with alcohol. The tourniquet was then removed and sterile dressing applied. ?Disposition: Patient tolerated procedure well. Patient to return in 2 weeks for follow-up.  ? ?No follow-ups on file. ?

## 2021-05-21 NOTE — Patient Instructions (Signed)

## 2021-07-29 ENCOUNTER — Other Ambulatory Visit: Payer: Self-pay | Admitting: Cardiology

## 2021-09-10 DIAGNOSIS — I509 Heart failure, unspecified: Secondary | ICD-10-CM | POA: Diagnosis not present

## 2021-09-10 DIAGNOSIS — E785 Hyperlipidemia, unspecified: Secondary | ICD-10-CM | POA: Diagnosis not present

## 2021-09-10 DIAGNOSIS — E261 Secondary hyperaldosteronism: Secondary | ICD-10-CM | POA: Diagnosis not present

## 2021-09-10 DIAGNOSIS — R32 Unspecified urinary incontinence: Secondary | ICD-10-CM | POA: Diagnosis not present

## 2021-09-10 DIAGNOSIS — I252 Old myocardial infarction: Secondary | ICD-10-CM | POA: Diagnosis not present

## 2021-09-10 DIAGNOSIS — F419 Anxiety disorder, unspecified: Secondary | ICD-10-CM | POA: Diagnosis not present

## 2021-09-10 DIAGNOSIS — Z7409 Other reduced mobility: Secondary | ICD-10-CM | POA: Diagnosis not present

## 2021-09-10 DIAGNOSIS — I110 Hypertensive heart disease with heart failure: Secondary | ICD-10-CM | POA: Diagnosis not present

## 2021-09-10 DIAGNOSIS — N3281 Overactive bladder: Secondary | ICD-10-CM | POA: Diagnosis not present

## 2021-09-30 DIAGNOSIS — N3941 Urge incontinence: Secondary | ICD-10-CM | POA: Diagnosis not present

## 2021-09-30 DIAGNOSIS — R3915 Urgency of urination: Secondary | ICD-10-CM | POA: Diagnosis not present

## 2021-10-08 ENCOUNTER — Other Ambulatory Visit: Payer: Self-pay | Admitting: Cardiology

## 2021-10-30 DIAGNOSIS — R7989 Other specified abnormal findings of blood chemistry: Secondary | ICD-10-CM | POA: Diagnosis not present

## 2021-10-30 DIAGNOSIS — E7800 Pure hypercholesterolemia, unspecified: Secondary | ICD-10-CM | POA: Diagnosis not present

## 2021-10-30 DIAGNOSIS — F411 Generalized anxiety disorder: Secondary | ICD-10-CM | POA: Diagnosis not present

## 2021-10-30 DIAGNOSIS — M810 Age-related osteoporosis without current pathological fracture: Secondary | ICD-10-CM | POA: Diagnosis not present

## 2021-10-30 DIAGNOSIS — I2510 Atherosclerotic heart disease of native coronary artery without angina pectoris: Secondary | ICD-10-CM | POA: Diagnosis not present

## 2021-11-06 DIAGNOSIS — Z1331 Encounter for screening for depression: Secondary | ICD-10-CM | POA: Diagnosis not present

## 2021-11-06 DIAGNOSIS — Z0000 Encounter for general adult medical examination without abnormal findings: Secondary | ICD-10-CM | POA: Diagnosis not present

## 2021-11-06 DIAGNOSIS — I130 Hypertensive heart and chronic kidney disease with heart failure and stage 1 through stage 4 chronic kidney disease, or unspecified chronic kidney disease: Secondary | ICD-10-CM | POA: Diagnosis not present

## 2021-11-06 DIAGNOSIS — Z7901 Long term (current) use of anticoagulants: Secondary | ICD-10-CM | POA: Diagnosis not present

## 2021-11-06 DIAGNOSIS — Z23 Encounter for immunization: Secondary | ICD-10-CM | POA: Diagnosis not present

## 2021-11-06 DIAGNOSIS — I509 Heart failure, unspecified: Secondary | ICD-10-CM | POA: Diagnosis not present

## 2021-11-06 DIAGNOSIS — R32 Unspecified urinary incontinence: Secondary | ICD-10-CM | POA: Diagnosis not present

## 2021-11-06 DIAGNOSIS — I255 Ischemic cardiomyopathy: Secondary | ICD-10-CM | POA: Diagnosis not present

## 2021-11-06 DIAGNOSIS — I2699 Other pulmonary embolism without acute cor pulmonale: Secondary | ICD-10-CM | POA: Diagnosis not present

## 2021-11-06 DIAGNOSIS — R82998 Other abnormal findings in urine: Secondary | ICD-10-CM | POA: Diagnosis not present

## 2021-11-06 DIAGNOSIS — D6869 Other thrombophilia: Secondary | ICD-10-CM | POA: Diagnosis not present

## 2021-11-06 DIAGNOSIS — I2510 Atherosclerotic heart disease of native coronary artery without angina pectoris: Secondary | ICD-10-CM | POA: Diagnosis not present

## 2021-11-06 DIAGNOSIS — Z1339 Encounter for screening examination for other mental health and behavioral disorders: Secondary | ICD-10-CM | POA: Diagnosis not present

## 2021-11-13 ENCOUNTER — Other Ambulatory Visit: Payer: Self-pay | Admitting: Physician Assistant

## 2021-11-13 DIAGNOSIS — I2699 Other pulmonary embolism without acute cor pulmonale: Secondary | ICD-10-CM

## 2021-11-13 NOTE — Telephone Encounter (Addendum)
Eliquis 2.5mg  refill request received. Patient is 88 years old, weight-64.6kg, Crea-0.90 via scanned labs from PCP on 11/06/2021, Diagnosis-PE, and last seen by Dr. Percival Spanish on 04/16/2021. Per OV note on 04/16/2021 regarding eliquis dose: We have had her on chronic anticoagulation low dose because this was an unprovoked PE. Also, This was an unprovoked pulmonary embolism.  No change in therapy.  She tolerates anticoagulation.  Continue meds as listed.  Will call PCP for updated labs. Left message for Dr. Loren Racer Nurse Estill Bamberg for updated labs to be faxed to [phone] or call back at [phone] Received labs from PCP Crea-0.90 on 11/06/2021

## 2021-11-14 DIAGNOSIS — L602 Onychogryphosis: Secondary | ICD-10-CM | POA: Diagnosis not present

## 2021-11-14 DIAGNOSIS — M79672 Pain in left foot: Secondary | ICD-10-CM | POA: Diagnosis not present

## 2021-11-14 DIAGNOSIS — L84 Corns and callosities: Secondary | ICD-10-CM | POA: Diagnosis not present

## 2021-11-14 DIAGNOSIS — M79671 Pain in right foot: Secondary | ICD-10-CM | POA: Diagnosis not present

## 2021-11-26 DIAGNOSIS — H43813 Vitreous degeneration, bilateral: Secondary | ICD-10-CM | POA: Diagnosis not present

## 2021-11-26 DIAGNOSIS — H04123 Dry eye syndrome of bilateral lacrimal glands: Secondary | ICD-10-CM | POA: Diagnosis not present

## 2021-11-26 DIAGNOSIS — H353211 Exudative age-related macular degeneration, right eye, with active choroidal neovascularization: Secondary | ICD-10-CM | POA: Diagnosis not present

## 2021-11-26 DIAGNOSIS — H524 Presbyopia: Secondary | ICD-10-CM | POA: Diagnosis not present

## 2021-11-26 DIAGNOSIS — Z961 Presence of intraocular lens: Secondary | ICD-10-CM | POA: Diagnosis not present

## 2021-11-26 DIAGNOSIS — H52203 Unspecified astigmatism, bilateral: Secondary | ICD-10-CM | POA: Diagnosis not present

## 2021-11-28 DIAGNOSIS — H353132 Nonexudative age-related macular degeneration, bilateral, intermediate dry stage: Secondary | ICD-10-CM | POA: Diagnosis not present

## 2021-11-28 DIAGNOSIS — H43392 Other vitreous opacities, left eye: Secondary | ICD-10-CM | POA: Diagnosis not present

## 2021-11-28 DIAGNOSIS — H31091 Other chorioretinal scars, right eye: Secondary | ICD-10-CM | POA: Diagnosis not present

## 2022-01-09 DIAGNOSIS — H31091 Other chorioretinal scars, right eye: Secondary | ICD-10-CM | POA: Diagnosis not present

## 2022-01-09 DIAGNOSIS — H43393 Other vitreous opacities, bilateral: Secondary | ICD-10-CM | POA: Diagnosis not present

## 2022-01-09 DIAGNOSIS — H353132 Nonexudative age-related macular degeneration, bilateral, intermediate dry stage: Secondary | ICD-10-CM | POA: Diagnosis not present

## 2022-01-24 ENCOUNTER — Other Ambulatory Visit: Payer: Self-pay | Admitting: Cardiology

## 2022-01-24 ENCOUNTER — Other Ambulatory Visit: Payer: Self-pay | Admitting: Physician Assistant

## 2022-03-18 ENCOUNTER — Other Ambulatory Visit: Payer: Self-pay | Admitting: Cardiology

## 2022-04-09 ENCOUNTER — Other Ambulatory Visit: Payer: Self-pay | Admitting: Cardiology

## 2022-04-22 NOTE — Progress Notes (Unsigned)
Cardiology Office Note   Date:  04/23/2022   ID:  PRETTY DILEO, DOB [birth year], MRN [id number]  PCP:  Haywood Pao, MD  Cardiologist:   Minus Breeding, MD   Chief Complaint  Patient presents with   Cardiomyopathy     History of Present Illness: Kristen Lewis is a 89 y.o. female who presents for follow up of CAD.  She had inferior MI in 2017.  Cath was done but they were unable to open her RCA and the recommendation was for medical treatment. She was admitted to [facility] hospital in Jan 2018 with HCAP and a PE. She was discharged back to [facility] and then re admitted 03/12/16 with pleuritic chest pain which we felt was from her PE.  We have had her on chronic anticoagulation low dose because this was an unprovoked PE.      Since she was last seen she has had a little bruising just overnight on her chest and the upper sternal area.  She does not remember bumping it.  She has not otherwise had any bleeding issues with her blood thinner.  She gets around quickly with her walker. The patient denies any new symptoms such as chest discomfort, neck or arm discomfort. There has been no new shortness of breath, PND or orthopnea. There have been no reported palpitations, presyncope or syncope.  She specifically today asks why she is taking the Lasix every day because she does not have any shortness of breath or swelling.  Past Medical History:  Diagnosis Date   Arthritis    CAD (coronary artery disease)    PCI to her RCA in Nov 2017 in [place] in setting of inferior STEMI. Residual 80% LAD, 70% CFX, 90% OM, EF 50%   Cardiomyopathy ([facility])    EF 45%   Elevated cholesterol    Hearing deficit    Hypertension    Vaginal pessary present     Past Surgical History:  Procedure Laterality Date   CARDIAC CATHETERIZATION     CHOLECYSTECTOMY     VAGINAL HYSTERECTOMY     Leiomyomata/bleeding     Current Outpatient Medications  Medication Sig Dispense Refill   atorvastatin (LIPITOR) 20 MG  tablet TAKE 1 TABLET EVERY DAY 90 tablet 3   Calcium-Magnesium-Vitamin D (CALCIUM 500 PO) Take 1 tablet by mouth daily.     carvedilol (COREG) 6.25 MG tablet TAKE 1 TABLET TWICE DAILY WITH MEALS 180 tablet 3   cholecalciferol (VITAMIN D) 1000 UNITS tablet Take 1,000 Units by mouth every morning.      ELIQUIS 2.5 MG TABS tablet TAKE 1 TABLET TWICE DAILY 180 tablet 1   GEMTESA 75 MG TABS Take 1 tablet by mouth daily.     Magnesium 250 MG TABS Take 1 tablet by mouth daily.     Multiple Vitamins-Minerals (CVS SPECTRAVITE ADULT 50+) TABS Take 1 tablet by mouth daily.     PARoxetine (PAXIL) 20 MG tablet Take 20 mg by mouth daily.      sacubitril-valsartan (ENTRESTO) 49-51 MG TAKE 1 TABLET TWICE DAILY 180 tablet 0   triamcinolone (KENALOG) 0.1 % daily.     zaleplon (SONATA) 5 MG capsule 1 tablet po qhs prn for insomnia     furosemide (LASIX) 40 MG tablet May take 20 mg (half tablet) to 40 mg as needed 30 tablet 2   nitroGLYCERIN (NITROSTAT) 0.4 MG SL tablet Place 1 tablet (0.4 mg total) under the tongue every 5 (five) minutes as needed for  chest pain. 25 tablet 2   No current facility-administered medications for this visit.    Allergies:   Patient has no known allergies.    ROS:  Please see the history of present illness.   Otherwise, review of systems are positive for none.   All other systems are reviewed and negative.    PHYSICAL EXAM: VS:  BP 112/64   Pulse (!) 59   Ht '5\' 1"'$  (1.549 m)   Wt 138 lb 12.8 oz (63 kg)   SpO2 97%   BMI 26.23 kg/m  , BMI Body mass index is 26.23 kg/m.  GENERAL:  Well appearing NECK:  No jugular venous distention, waveform within normal limits, carotid upstroke brisk and symmetric, no bruits, no thyromegaly LUNGS:  Clear to auscultation bilaterally CHEST:  Unremarkable except for bruise in the upper sternal area likely related to spontaneous rupture of a small vessel HEART:  PMI not displaced or sustained,S1 and S2 within normal limits, no S3, no S4, no  clicks, no rubs, 2 out of 6 apical systolic murmur radiating slightly at the aortic outflow tract but early peaking, no diastolic murmurs ABD:  Flat, positive bowel sounds normal in frequency in pitch, no bruits, no rebound, no guarding, no midline pulsatile mass, no hepatomegaly, no splenomegaly EXT:  2 plus pulses throughout, no edema, no cyanosis no clubbing  EKG:  EKG is  ordered today. Sinus rhythm, rate 60, axis leftward, poor anterior R wave progression, no acute ST-T wave changes, no change from previous  Recent Labs: No results found for requested labs within last 365 days.    Lipid Panel No results found for: "CHOL", "TRIG", "HDL", "CHOLHDL", "VLDL", "LDLCALC", "LDLDIRECT"    Wt Readings from Last 3 Encounters:  04/23/22 138 lb 12.8 oz (63 kg)  04/16/21 142 lb 6.4 oz (64.6 kg)  10/31/20 137 lb 3.2 oz (62.2 kg)      Other studies Reviewed: Additional studies/ records that were reviewed today include:  None Review of the above records demonstrates: NA  ASSESSMENT AND PLAN:   CAD S/P unsuccessful PCI Nov 2017 The patient has no new sypmtoms.  No further cardiovascular testing is indicated.  We will continue with aggressive risk reduction and meds as listed.  Ischemic cardiomyopathy I am not can to titrate meds further.  She is asymptomatic.  With her advanced age I would prefer to be more conservative on her therapy and she would appreciate this.  In fact she would like to stop taking all of furosemide.  I suggested that she go to 20 mg every day and then can stop taking it and use it only as needed if she does well with the lower dose.  Pulmonary embolus ([place]) She is going to have a CBC drawn by her primary provider.  She tolerates anticoagulation.  Dyslipidemia LDL was 57 with an HDL 49.  No change in therapy.  Current medicines are reviewed at length with the patient today.  The patient does not have concerns regarding medicines.  The following changes have been  made: As above Labs/ tests ordered today include: None  Orders Placed This Encounter  Procedures   EKG 12-Lead    Disposition:   FU with me in one year  Signed, Minus Breeding, MD  04/23/2022 11:06 AM    [place]

## 2022-04-23 ENCOUNTER — Ambulatory Visit: Payer: Medicare PPO | Attending: Cardiology | Admitting: Cardiology

## 2022-04-23 ENCOUNTER — Encounter: Payer: Self-pay | Admitting: Cardiology

## 2022-04-23 VITALS — BP 112/64 | HR 59 | Ht 61.0 in | Wt 138.8 lb

## 2022-04-23 DIAGNOSIS — Z9861 Coronary angioplasty status: Secondary | ICD-10-CM | POA: Diagnosis not present

## 2022-04-23 DIAGNOSIS — E785 Hyperlipidemia, unspecified: Secondary | ICD-10-CM

## 2022-04-23 DIAGNOSIS — I2699 Other pulmonary embolism without acute cor pulmonale: Secondary | ICD-10-CM | POA: Diagnosis not present

## 2022-04-23 DIAGNOSIS — I255 Ischemic cardiomyopathy: Secondary | ICD-10-CM | POA: Diagnosis not present

## 2022-04-23 DIAGNOSIS — I2510 Atherosclerotic heart disease of native coronary artery without angina pectoris: Secondary | ICD-10-CM | POA: Diagnosis not present

## 2022-04-23 MED ORDER — NITROGLYCERIN 0.4 MG SL SUBL: 0.4000 mg | SUBLINGUAL_TABLET | SUBLINGUAL | 2 refills | Status: AC | PRN

## 2022-04-23 MED ORDER — FUROSEMIDE 40 MG PO TABS: ORAL_TABLET | ORAL | 2 refills | Status: AC

## 2022-04-23 NOTE — Patient Instructions (Addendum)
Medication Instructions:   TAKE Lasix as needed   *If you need a refill on your cardiac medications before your next appointment, please call your pharmacy*  Lab Work: NONE ordered at this time of appointment   If you have labs (blood work) drawn today and your tests are completely normal, you will receive your results only by: [place] (if you have MyChart) OR A paper copy in the mail If you have any lab test that is abnormal or we need to change your treatment, we will call you to review the results.  Testing/Procedures: NONE ordered at this time of appointment   Follow-Up: At [facility], you and your health needs are our priority.  As part of our continuing mission to provide you with exceptional heart care, we have created designated Provider Care Teams.  These Care Teams include your primary Cardiologist (physician) and Advanced Practice Providers (APPs -  Physician Assistants and Nurse Practitioners) who all work together to provide you with the care you need, when you need it.   Your next appointment:   1 year(s)  Provider:   Minus Breeding, MD     Other Instructions

## 2022-05-04 DIAGNOSIS — I2699 Other pulmonary embolism without acute cor pulmonale: Secondary | ICD-10-CM | POA: Diagnosis not present

## 2022-05-04 DIAGNOSIS — I509 Heart failure, unspecified: Secondary | ICD-10-CM | POA: Diagnosis not present

## 2022-05-04 DIAGNOSIS — E7800 Pure hypercholesterolemia, unspecified: Secondary | ICD-10-CM | POA: Diagnosis not present

## 2022-05-04 DIAGNOSIS — R32 Unspecified urinary incontinence: Secondary | ICD-10-CM | POA: Diagnosis not present

## 2022-05-04 DIAGNOSIS — I130 Hypertensive heart and chronic kidney disease with heart failure and stage 1 through stage 4 chronic kidney disease, or unspecified chronic kidney disease: Secondary | ICD-10-CM | POA: Diagnosis not present

## 2022-05-04 DIAGNOSIS — I2510 Atherosclerotic heart disease of native coronary artery without angina pectoris: Secondary | ICD-10-CM | POA: Diagnosis not present

## 2022-05-04 DIAGNOSIS — Z7901 Long term (current) use of anticoagulants: Secondary | ICD-10-CM | POA: Diagnosis not present

## 2022-05-04 DIAGNOSIS — D6869 Other thrombophilia: Secondary | ICD-10-CM | POA: Diagnosis not present

## 2022-05-04 DIAGNOSIS — I255 Ischemic cardiomyopathy: Secondary | ICD-10-CM | POA: Diagnosis not present

## 2022-06-22 ENCOUNTER — Other Ambulatory Visit: Payer: Self-pay | Admitting: Cardiology

## 2022-06-22 DIAGNOSIS — I2699 Other pulmonary embolism without acute cor pulmonale: Secondary | ICD-10-CM

## 2022-06-22 NOTE — Telephone Encounter (Signed)
Prescription refill request for Eliquis received. Indication:pe Last office visit:3/24 Scr:1.0 Age: 89 Weight:63  kg  Prescription refilled

## 2022-11-02 DIAGNOSIS — Z01110 Encounter for hearing examination following failed hearing screening: Secondary | ICD-10-CM | POA: Diagnosis not present

## 2022-11-03 DIAGNOSIS — I509 Heart failure, unspecified: Secondary | ICD-10-CM | POA: Diagnosis not present

## 2022-11-03 DIAGNOSIS — E7849 Other hyperlipidemia: Secondary | ICD-10-CM | POA: Diagnosis not present

## 2022-11-03 DIAGNOSIS — I2510 Atherosclerotic heart disease of native coronary artery without angina pectoris: Secondary | ICD-10-CM | POA: Diagnosis not present

## 2022-11-03 DIAGNOSIS — Z1389 Encounter for screening for other disorder: Secondary | ICD-10-CM | POA: Diagnosis not present

## 2022-11-03 DIAGNOSIS — I130 Hypertensive heart and chronic kidney disease with heart failure and stage 1 through stage 4 chronic kidney disease, or unspecified chronic kidney disease: Secondary | ICD-10-CM | POA: Diagnosis not present

## 2022-11-03 DIAGNOSIS — E7800 Pure hypercholesterolemia, unspecified: Secondary | ICD-10-CM | POA: Diagnosis not present

## 2022-11-03 DIAGNOSIS — M810 Age-related osteoporosis without current pathological fracture: Secondary | ICD-10-CM | POA: Diagnosis not present

## 2022-11-03 DIAGNOSIS — Z79899 Other long term (current) drug therapy: Secondary | ICD-10-CM | POA: Diagnosis not present

## 2022-11-03 DIAGNOSIS — N1831 Chronic kidney disease, stage 3a: Secondary | ICD-10-CM | POA: Diagnosis not present

## 2022-11-10 DIAGNOSIS — Z1331 Encounter for screening for depression: Secondary | ICD-10-CM | POA: Diagnosis not present

## 2022-11-10 DIAGNOSIS — I509 Heart failure, unspecified: Secondary | ICD-10-CM | POA: Diagnosis not present

## 2022-11-10 DIAGNOSIS — Z1339 Encounter for screening examination for other mental health and behavioral disorders: Secondary | ICD-10-CM | POA: Diagnosis not present

## 2022-11-10 DIAGNOSIS — Z23 Encounter for immunization: Secondary | ICD-10-CM | POA: Diagnosis not present

## 2022-11-10 DIAGNOSIS — F411 Generalized anxiety disorder: Secondary | ICD-10-CM | POA: Diagnosis not present

## 2022-11-10 DIAGNOSIS — R32 Unspecified urinary incontinence: Secondary | ICD-10-CM | POA: Diagnosis not present

## 2022-11-10 DIAGNOSIS — F321 Major depressive disorder, single episode, moderate: Secondary | ICD-10-CM | POA: Diagnosis not present

## 2022-11-10 DIAGNOSIS — N1831 Chronic kidney disease, stage 3a: Secondary | ICD-10-CM | POA: Diagnosis not present

## 2022-11-10 DIAGNOSIS — Z0000 Encounter for general adult medical examination without abnormal findings: Secondary | ICD-10-CM | POA: Diagnosis not present

## 2022-11-10 DIAGNOSIS — D6869 Other thrombophilia: Secondary | ICD-10-CM | POA: Diagnosis not present

## 2022-11-10 DIAGNOSIS — I2699 Other pulmonary embolism without acute cor pulmonale: Secondary | ICD-10-CM | POA: Diagnosis not present

## 2022-11-10 DIAGNOSIS — I130 Hypertensive heart and chronic kidney disease with heart failure and stage 1 through stage 4 chronic kidney disease, or unspecified chronic kidney disease: Secondary | ICD-10-CM | POA: Diagnosis not present

## 2022-11-11 ENCOUNTER — Other Ambulatory Visit: Payer: Self-pay | Admitting: Cardiology

## 2023-01-16 ENCOUNTER — Other Ambulatory Visit: Payer: Self-pay | Admitting: Cardiology

## 2023-02-17 DIAGNOSIS — H43813 Vitreous degeneration, bilateral: Secondary | ICD-10-CM | POA: Diagnosis not present

## 2023-02-17 DIAGNOSIS — Z961 Presence of intraocular lens: Secondary | ICD-10-CM | POA: Diagnosis not present

## 2023-02-17 DIAGNOSIS — H04123 Dry eye syndrome of bilateral lacrimal glands: Secondary | ICD-10-CM | POA: Diagnosis not present

## 2023-02-17 DIAGNOSIS — H353132 Nonexudative age-related macular degeneration, bilateral, intermediate dry stage: Secondary | ICD-10-CM | POA: Diagnosis not present

## 2023-02-17 DIAGNOSIS — H52203 Unspecified astigmatism, bilateral: Secondary | ICD-10-CM | POA: Diagnosis not present

## 2023-04-02 ENCOUNTER — Other Ambulatory Visit: Payer: Self-pay | Admitting: Cardiology

## 2023-04-02 DIAGNOSIS — I2699 Other pulmonary embolism without acute cor pulmonale: Secondary | ICD-10-CM

## 2023-04-02 NOTE — Telephone Encounter (Signed)
Prescription refill request for Eliquis received. Indication:pe Last office visit:3/24 [id number] labs Age: 89 Weight:63  kg  Prescription refilled

## 2023-04-22 NOTE — Progress Notes (Unsigned)
  Cardiology Office Note:   Date:  04/23/2023  ID:  Kristen Lewis, DOB January 15, 1926, MRN [id number] PCP: Gaspar Garbe, MD  [facility] Providers Cardiologist:  Rollene Rotunda, MD {  History of Present Illness:   Kristen Lewis is a 89 y.o. female who presents for follow up of CAD.  She had inferior MI in 2017.  Cath was done but they were unable to open her RCA and the recommendation was for medical treatment. She was admitted to [facility] hospital in Jan 2018 with HCAP and a PE. She was discharged back to [facility] and then re admitted 03/12/16 with pleuritic chest pain which we felt was from her PE.  We have had her on chronic anticoagulation low dose because this was an unprovoked PE.       Since she was last seen she has had no new problems.  She ambulates to the dining hall. The patient denies any new symptoms such as chest discomfort, neck or arm discomfort. There has been no new shortness of breath, PND or orthopnea. There have been no reported palpitations, presyncope or syncope.  She is planning to go to weddings this year.  ROS: As stated in the HPI and negative for all other systems.  Studies Reviewed:    EKG:   EKG Interpretation Date/Time:  Friday April 23 2023 10:44:06 EDT Ventricular Rate:  68 PR Interval:  194 QRS Duration:  104 QT Interval:  386 QTC Calculation: 410 R Axis:   -39  Text Interpretation: Normal sinus rhythm Left axis deviation Anterolateral infarct (cited on or before 12-Mar-2016) When compared with ECG of 12-Mar-2016 11:02, Vent. rate has decreased BY  41 BPM Confirmed by Rollene Rotunda ([id number]) on 04/23/2023 11:02:46 AM    Risk Assessment/Calculations:              Physical Exam:   VS:  BP 137/70   Pulse 66   Ht 5\' 2"  (1.575 m)   Wt 135 lb 3.2 oz (61.3 kg)   SpO2 97%   BMI 24.73 kg/m    Wt Readings from Last 3 Encounters:  04/23/23 135 lb 3.2 oz (61.3 kg)  04/23/22 138 lb 12.8 oz (63 kg)  04/16/21 142 lb 6.4 oz (64.6 kg)      GEN: Well nourished, well developed in no acute distress NECK: No JVD; No carotid bruits CARDIAC: RRR, soft apical systolic murmur, no diastolic murmurs, rubs, gallops RESPIRATORY:  Clear to auscultation without rales, wheezing or rhonchi  ABDOMEN: Soft, non-tender, non-distended EXTREMITIES:  No edema; No deformity   ASSESSMENT AND PLAN:   CAD S/P unsuccessful PCI Nov 2017: Patient had no new anginal symptoms.  We are continue with risk reduction.  Ischemic cardiomyopathy: EF was 35 to 40% in 2021.  She seems to be euvolemic.  No change in therapy.  No further testing.  She is up-to-date with follow-up blood work.  Pulmonary embolus ([facility]): This was unprovoked previously.  She tolerates anticoagulation.  No change in therapy.  Dyslipidemia: LDL was 54.  No change in therapy. LDL was 57 with an HDL 49.  No change in therapy.     Follow up with me in one year.   Signed, Rollene Rotunda, MD

## 2023-04-23 ENCOUNTER — Ambulatory Visit: Payer: Medicare PPO | Attending: Cardiology | Admitting: Cardiology

## 2023-04-23 ENCOUNTER — Encounter: Payer: Self-pay | Admitting: Cardiology

## 2023-04-23 VITALS — BP 137/70 | HR 66 | Ht 62.0 in | Wt 135.2 lb

## 2023-04-23 DIAGNOSIS — Z9861 Coronary angioplasty status: Secondary | ICD-10-CM

## 2023-04-23 DIAGNOSIS — I255 Ischemic cardiomyopathy: Secondary | ICD-10-CM

## 2023-04-23 DIAGNOSIS — E785 Hyperlipidemia, unspecified: Secondary | ICD-10-CM | POA: Diagnosis not present

## 2023-04-23 DIAGNOSIS — I2510 Atherosclerotic heart disease of native coronary artery without angina pectoris: Secondary | ICD-10-CM

## 2023-04-23 NOTE — Patient Instructions (Signed)
 Medication Instructions:  No changes.  *If you need a refill on your cardiac medications before your next appointment, please call your pharmacy*  Follow-Up: At [facility], you and your health needs are our priority.  As part of our continuing mission to provide you with exceptional heart care, we have created designated Provider Care Teams.  These Care Teams include your primary Cardiologist (physician) and Advanced Practice Providers (APPs -  Physician Assistants and Nurse Practitioners) who all work together to provide you with the care you need, when you need it.  We recommend signing up for the patient portal called "MyChart".  Sign up information is provided on this After Visit Summary.  MyChart is used to connect with patients for Virtual Visits (Telemedicine).  Patients are able to view lab/test results, encounter notes, upcoming appointments, etc.  Non-urgent messages can be sent to your provider as well.   To learn more about what you can do with MyChart, go to [email or web address].    Your next appointment:   1 year(s)  Provider:   Rollene Rotunda, MD     Other Instructions  o

## 2023-04-24 ENCOUNTER — Other Ambulatory Visit: Payer: Self-pay | Admitting: Cardiology

## 2023-05-18 DIAGNOSIS — E7800 Pure hypercholesterolemia, unspecified: Secondary | ICD-10-CM | POA: Diagnosis not present

## 2023-05-18 DIAGNOSIS — Z7901 Long term (current) use of anticoagulants: Secondary | ICD-10-CM | POA: Diagnosis not present

## 2023-05-18 DIAGNOSIS — D692 Other nonthrombocytopenic purpura: Secondary | ICD-10-CM | POA: Diagnosis not present

## 2023-05-18 DIAGNOSIS — I2699 Other pulmonary embolism without acute cor pulmonale: Secondary | ICD-10-CM | POA: Diagnosis not present

## 2023-05-18 DIAGNOSIS — N1831 Chronic kidney disease, stage 3a: Secondary | ICD-10-CM | POA: Diagnosis not present

## 2023-05-18 DIAGNOSIS — I255 Ischemic cardiomyopathy: Secondary | ICD-10-CM | POA: Diagnosis not present

## 2023-05-18 DIAGNOSIS — I130 Hypertensive heart and chronic kidney disease with heart failure and stage 1 through stage 4 chronic kidney disease, or unspecified chronic kidney disease: Secondary | ICD-10-CM | POA: Diagnosis not present

## 2023-05-18 DIAGNOSIS — L309 Dermatitis, unspecified: Secondary | ICD-10-CM | POA: Diagnosis not present

## 2023-05-18 DIAGNOSIS — R32 Unspecified urinary incontinence: Secondary | ICD-10-CM | POA: Diagnosis not present

## 2023-05-18 DIAGNOSIS — I509 Heart failure, unspecified: Secondary | ICD-10-CM | POA: Diagnosis not present

## 2023-05-18 DIAGNOSIS — I2510 Atherosclerotic heart disease of native coronary artery without angina pectoris: Secondary | ICD-10-CM | POA: Diagnosis not present

## 2023-06-16 ENCOUNTER — Other Ambulatory Visit: Payer: Self-pay | Admitting: Cardiology

## 2023-06-16 DIAGNOSIS — I2699 Other pulmonary embolism without acute cor pulmonale: Secondary | ICD-10-CM

## 2023-06-16 NOTE — Telephone Encounter (Signed)
 Called PCP and spoke with Gregary Lean. Requested updated labs to be fax to anticoagulation clinic.

## 2023-06-16 NOTE — Telephone Encounter (Signed)
 Labs received from PCP. Scr 1.1 on 11/03/22.   Per office visit note on 04/23/22: We have had her on chronic anticoagulation low dose because this was an unprovoked PE.       Refill sent.

## 2023-06-16 NOTE — Telephone Encounter (Signed)
 Prescription refill request for Eliquis  received. Indication: PE  Last office visit: 04/23/23 (Hochrein)  Scr: 1.0 (10/16/20)  Age: 89 Weight: 61.3kg  Labs overdue. Pt had labs drawn at PCP in September of 2024. Will call PCP to requested updated labs.

## 2023-11-10 DIAGNOSIS — M810 Age-related osteoporosis without current pathological fracture: Secondary | ICD-10-CM | POA: Diagnosis not present

## 2023-11-10 DIAGNOSIS — E7800 Pure hypercholesterolemia, unspecified: Secondary | ICD-10-CM | POA: Diagnosis not present

## 2023-11-10 DIAGNOSIS — I130 Hypertensive heart and chronic kidney disease with heart failure and stage 1 through stage 4 chronic kidney disease, or unspecified chronic kidney disease: Secondary | ICD-10-CM | POA: Diagnosis not present

## 2023-11-10 DIAGNOSIS — R7301 Impaired fasting glucose: Secondary | ICD-10-CM | POA: Diagnosis not present

## 2023-11-17 DIAGNOSIS — Z0000 Encounter for general adult medical examination without abnormal findings: Secondary | ICD-10-CM | POA: Diagnosis not present

## 2023-11-17 DIAGNOSIS — F321 Major depressive disorder, single episode, moderate: Secondary | ICD-10-CM | POA: Diagnosis not present

## 2023-11-17 DIAGNOSIS — I130 Hypertensive heart and chronic kidney disease with heart failure and stage 1 through stage 4 chronic kidney disease, or unspecified chronic kidney disease: Secondary | ICD-10-CM | POA: Diagnosis not present

## 2023-11-17 DIAGNOSIS — I2510 Atherosclerotic heart disease of native coronary artery without angina pectoris: Secondary | ICD-10-CM | POA: Diagnosis not present

## 2023-11-17 DIAGNOSIS — L309 Dermatitis, unspecified: Secondary | ICD-10-CM | POA: Diagnosis not present

## 2023-11-17 DIAGNOSIS — N1831 Chronic kidney disease, stage 3a: Secondary | ICD-10-CM | POA: Diagnosis not present

## 2023-11-17 DIAGNOSIS — I509 Heart failure, unspecified: Secondary | ICD-10-CM | POA: Diagnosis not present

## 2023-11-17 DIAGNOSIS — E7800 Pure hypercholesterolemia, unspecified: Secondary | ICD-10-CM | POA: Diagnosis not present

## 2023-11-17 DIAGNOSIS — Z23 Encounter for immunization: Secondary | ICD-10-CM | POA: Diagnosis not present

## 2023-11-17 DIAGNOSIS — D6869 Other thrombophilia: Secondary | ICD-10-CM | POA: Diagnosis not present

## 2023-11-17 DIAGNOSIS — Z7901 Long term (current) use of anticoagulants: Secondary | ICD-10-CM | POA: Diagnosis not present

## 2023-11-17 DIAGNOSIS — Z1339 Encounter for screening examination for other mental health and behavioral disorders: Secondary | ICD-10-CM | POA: Diagnosis not present

## 2024-02-03 ENCOUNTER — Other Ambulatory Visit: Payer: Self-pay | Admitting: Cardiology

## 2024-02-03 DIAGNOSIS — I2699 Other pulmonary embolism without acute cor pulmonale: Secondary | ICD-10-CM

## 2024-02-04 NOTE — Telephone Encounter (Signed)
 Prescription refill request for Eliquis  received. Indication:pe Last office visit:3/25 Scr: needs labs Age:89 Weight:61.3  kg  Prescription refilled
